# Patient Record
Sex: Female | Born: 1955 | Race: White | Hispanic: No | Marital: Married | Smoking: Former smoker
Health system: Southern US, Community
[De-identification: ages and names within clinical notes are randomized; demographics above are authoritative.]

## PROBLEM LIST (undated history)

## (undated) DIAGNOSIS — F32A Depression, unspecified: Secondary | ICD-10-CM

## (undated) DIAGNOSIS — F419 Anxiety disorder, unspecified: Secondary | ICD-10-CM

## (undated) DIAGNOSIS — K219 Gastro-esophageal reflux disease without esophagitis: Secondary | ICD-10-CM

## (undated) DIAGNOSIS — Z9889 Other specified postprocedural states: Secondary | ICD-10-CM

## (undated) DIAGNOSIS — T8859XA Other complications of anesthesia, initial encounter: Secondary | ICD-10-CM

## (undated) DIAGNOSIS — E785 Hyperlipidemia, unspecified: Secondary | ICD-10-CM

## (undated) DIAGNOSIS — D649 Anemia, unspecified: Secondary | ICD-10-CM

## (undated) DIAGNOSIS — H5440 Blindness, one eye, unspecified eye: Secondary | ICD-10-CM

## (undated) DIAGNOSIS — R112 Nausea with vomiting, unspecified: Secondary | ICD-10-CM

## (undated) DIAGNOSIS — E669 Obesity, unspecified: Secondary | ICD-10-CM

## (undated) DIAGNOSIS — Z87442 Personal history of urinary calculi: Secondary | ICD-10-CM

## (undated) DIAGNOSIS — Z8719 Personal history of other diseases of the digestive system: Secondary | ICD-10-CM

## (undated) DIAGNOSIS — R519 Headache, unspecified: Secondary | ICD-10-CM

## (undated) DIAGNOSIS — E119 Type 2 diabetes mellitus without complications: Secondary | ICD-10-CM

## (undated) DIAGNOSIS — T4145XA Adverse effect of unspecified anesthetic, initial encounter: Secondary | ICD-10-CM

## (undated) DIAGNOSIS — E7800 Pure hypercholesterolemia, unspecified: Secondary | ICD-10-CM

## (undated) HISTORY — PX: CARDIAC CATHETERIZATION: SHX172

## (undated) HISTORY — PX: OTHER SURGICAL HISTORY: SHX169

## (undated) HISTORY — PX: TUBAL LIGATION: SHX77

## (undated) SURGERY — Surgical Case
Anesthesia: *Unknown

---

## 1981-12-03 HISTORY — PX: REDUCTION MAMMAPLASTY: SUR839

## 2004-10-28 ENCOUNTER — Other Ambulatory Visit: Payer: Self-pay

## 2004-10-28 ENCOUNTER — Inpatient Hospital Stay: Payer: Self-pay | Admitting: Rheumatology

## 2005-01-03 ENCOUNTER — Ambulatory Visit: Payer: Self-pay

## 2005-03-01 ENCOUNTER — Ambulatory Visit: Payer: Self-pay | Admitting: Unknown Physician Specialty

## 2005-08-22 ENCOUNTER — Ambulatory Visit: Payer: Self-pay | Admitting: Unknown Physician Specialty

## 2006-11-27 ENCOUNTER — Ambulatory Visit: Payer: Self-pay | Admitting: Unknown Physician Specialty

## 2007-12-09 ENCOUNTER — Ambulatory Visit: Payer: Self-pay | Admitting: Unknown Physician Specialty

## 2007-12-15 ENCOUNTER — Ambulatory Visit: Payer: Self-pay | Admitting: Unknown Physician Specialty

## 2008-12-15 ENCOUNTER — Ambulatory Visit: Payer: Self-pay | Admitting: Unknown Physician Specialty

## 2009-02-09 ENCOUNTER — Ambulatory Visit: Payer: Self-pay | Admitting: Family Medicine

## 2009-12-28 ENCOUNTER — Ambulatory Visit: Payer: Self-pay | Admitting: Unknown Physician Specialty

## 2011-01-12 ENCOUNTER — Ambulatory Visit: Payer: Self-pay | Admitting: Unknown Physician Specialty

## 2011-02-09 ENCOUNTER — Ambulatory Visit: Payer: Self-pay | Admitting: Unknown Physician Specialty

## 2012-01-23 ENCOUNTER — Ambulatory Visit: Payer: Self-pay | Admitting: Unknown Physician Specialty

## 2013-02-18 ENCOUNTER — Ambulatory Visit: Payer: Self-pay | Admitting: Unknown Physician Specialty

## 2013-06-16 ENCOUNTER — Ambulatory Visit: Payer: Self-pay | Admitting: Family Medicine

## 2013-11-20 ENCOUNTER — Ambulatory Visit: Payer: Self-pay | Admitting: Gastroenterology

## 2013-11-24 LAB — PATHOLOGY REPORT

## 2014-02-19 ENCOUNTER — Ambulatory Visit: Payer: Self-pay | Admitting: Internal Medicine

## 2014-07-30 ENCOUNTER — Ambulatory Visit (INDEPENDENT_AMBULATORY_CARE_PROVIDER_SITE_OTHER): Payer: 59 | Admitting: Podiatry

## 2014-07-30 ENCOUNTER — Encounter: Payer: Self-pay | Admitting: Podiatry

## 2014-07-30 ENCOUNTER — Ambulatory Visit (INDEPENDENT_AMBULATORY_CARE_PROVIDER_SITE_OTHER): Payer: 59

## 2014-07-30 VITALS — BP 113/65 | HR 66 | Resp 16 | Ht 62.0 in | Wt 195.0 lb

## 2014-07-30 DIAGNOSIS — M779 Enthesopathy, unspecified: Secondary | ICD-10-CM

## 2014-07-30 DIAGNOSIS — M7750 Other enthesopathy of unspecified foot: Secondary | ICD-10-CM

## 2014-07-30 MED ORDER — TRIAMCINOLONE ACETONIDE 10 MG/ML IJ SUSP
10.0000 mg | Freq: Once | INTRAMUSCULAR | Status: AC
  Administered 2014-07-30: 10 mg

## 2014-07-30 NOTE — Progress Notes (Signed)
Subjective:     Patient ID: Erika Gutierrez, female   DOB: [birth year], 57 y.o.   MRN: [id number]  Foot Pain   patient presents stating I'm getting a lot of pain on top of my left foot and I went to another Dr. who put me in his surgical shoe which has not helped me. It's been really hurting me for the last 3 weeks   Review of Systems  All other systems reviewed and are negative.      Objective:   Physical Exam  Nursing note and vitals reviewed. Constitutional: She is oriented to person, place, and time.  Cardiovascular: Intact distal pulses.   Musculoskeletal: Normal range of motion.  Neurological: She is oriented to person, place, and time.  Skin: Skin is warm.   neurovascular status found to be intact with muscle strength adequate and range of motion subtalar and midtarsal joint within normal limits. Patient's found to have discomfort on the dorsal lateral aspect of the left foot around the midtarsal joint with mild discomfort on the medial side and moderate edema noted upon compression. Digits are well perfused arch height is normal and no other pathology noted with diabetes which is under reasonable control     Assessment:     Probable tendinitis left dorsal foot with possibility for underlying arthritis    Plan:     H&P and x-rays reviewed. Injected the dorsal lateral foot 3 mg Kenalog 5 mg Xylocaine Marcaine mixture and reappoint in 1 week and advised on low grade physical therapy for the area

## 2014-07-30 NOTE — Progress Notes (Signed)
   Subjective:    Patient ID: Erika Gutierrez, female    DOB: 06/14/1956, 58 y.o.   MRN: [id number]  HPI Comments: i have pain and swelling on the top of my left foot. This has been going on for 3 weeks. Its remained the same. It hurts to walk. i have wore a darco shoe but it made it worse. Ive taken advil. i seen dr Ether Griffins 2 weeks ago and he took x-rays, darco shoe, advil and used ice.  Foot Pain      Review of Systems  All other systems reviewed and are negative.      Objective:   Physical Exam        Assessment & Plan:

## 2014-08-06 ENCOUNTER — Ambulatory Visit (INDEPENDENT_AMBULATORY_CARE_PROVIDER_SITE_OTHER): Payer: 59 | Admitting: Podiatry

## 2014-08-06 VITALS — BP 122/69 | HR 76 | Resp 16

## 2014-08-06 DIAGNOSIS — M7750 Other enthesopathy of unspecified foot: Secondary | ICD-10-CM

## 2014-08-06 NOTE — Progress Notes (Signed)
Subjective:     Patient ID: Erika Gutierrez, female   DOB: 02-16-1956, 58 y.o.   MRN: [id number]  HPI patient states that my left foot feels a lot better and I'm able to walk without discomfort   Review of Systems     Objective:   Physical Exam Neurovascular status intact no change in health history well oriented x3 and noted to have significant diminishment of discomfort in the dorsal lateral aspect of the left foot    Assessment:     Tendinitis improved quite dramatically left    Plan:     Advised on supportive shoe and physical therapy. If symptoms persist we will consider orthotics

## 2014-08-25 ENCOUNTER — Ambulatory Visit: Payer: Self-pay | Admitting: Unknown Physician Specialty

## 2015-02-22 ENCOUNTER — Ambulatory Visit: Payer: Self-pay | Admitting: Physician Assistant

## 2015-03-04 ENCOUNTER — Ambulatory Visit
Admit: 2015-03-04 | Disposition: A | Discharge status: Home / Self Care | Payer: Self-pay | Attending: Physician Assistant | Admitting: Physician Assistant

## 2015-03-07 ENCOUNTER — Ambulatory Visit
Admit: 2015-03-07 | Disposition: A | Discharge status: Home / Self Care | Payer: Self-pay | Attending: Physician Assistant | Admitting: Physician Assistant

## 2015-03-07 HISTORY — PX: BREAST BIOPSY: SHX20

## 2015-03-28 LAB — SURGICAL PATHOLOGY

## 2016-02-07 ENCOUNTER — Other Ambulatory Visit: Payer: Self-pay | Admitting: Physician Assistant

## 2016-02-07 DIAGNOSIS — Z1231 Encounter for screening mammogram for malignant neoplasm of breast: Secondary | ICD-10-CM

## 2016-02-23 ENCOUNTER — Ambulatory Visit
Admission: RE | Admit: 2016-02-23 | Discharge: 2016-02-23 | Disposition: A | Discharge status: Home / Self Care | Payer: 59 | Source: Ambulatory Visit | Attending: Physician Assistant | Admitting: Physician Assistant

## 2016-02-23 DIAGNOSIS — Z1231 Encounter for screening mammogram for malignant neoplasm of breast: Secondary | ICD-10-CM | POA: Diagnosis present

## 2016-03-01 ENCOUNTER — Other Ambulatory Visit: Payer: Self-pay | Admitting: Physician Assistant

## 2016-03-01 DIAGNOSIS — R928 Other abnormal and inconclusive findings on diagnostic imaging of breast: Secondary | ICD-10-CM

## 2016-03-09 ENCOUNTER — Ambulatory Visit
Admission: RE | Admit: 2016-03-09 | Discharge: 2016-03-09 | Disposition: A | Discharge status: Home / Self Care | Payer: 59 | Source: Ambulatory Visit | Attending: Physician Assistant | Admitting: Physician Assistant

## 2016-03-09 DIAGNOSIS — N63 Unspecified lump in breast: Secondary | ICD-10-CM | POA: Diagnosis not present

## 2016-03-09 DIAGNOSIS — R921 Mammographic calcification found on diagnostic imaging of breast: Secondary | ICD-10-CM | POA: Insufficient documentation

## 2016-03-09 DIAGNOSIS — R928 Other abnormal and inconclusive findings on diagnostic imaging of breast: Secondary | ICD-10-CM

## 2016-10-31 ENCOUNTER — Ambulatory Visit
Admission: EM | Admit: 2016-10-31 | Discharge: 2016-10-31 | Disposition: A | Discharge status: Home / Self Care | Payer: 59 | Attending: Family Medicine | Admitting: Family Medicine

## 2016-10-31 DIAGNOSIS — R112 Nausea with vomiting, unspecified: Secondary | ICD-10-CM | POA: Diagnosis not present

## 2016-10-31 DIAGNOSIS — J0100 Acute maxillary sinusitis, unspecified: Secondary | ICD-10-CM | POA: Diagnosis not present

## 2016-10-31 DIAGNOSIS — J219 Acute bronchiolitis, unspecified: Secondary | ICD-10-CM

## 2016-10-31 DIAGNOSIS — K529 Noninfective gastroenteritis and colitis, unspecified: Secondary | ICD-10-CM

## 2016-10-31 HISTORY — DX: Type 2 diabetes mellitus without complications: E11.9

## 2016-10-31 HISTORY — DX: Pure hypercholesterolemia, unspecified: E78.00

## 2016-10-31 HISTORY — DX: Anxiety disorder, unspecified: F41.9

## 2016-10-31 LAB — RAPID INFLUENZA A&B ANTIGENS (ARMC ONLY)
INFLUENZA A (ARMC): NEGATIVE
INFLUENZA B (ARMC): NEGATIVE

## 2016-10-31 MED ORDER — BENZONATATE 200 MG PO CAPS: 200.0000 mg | ORAL_CAPSULE | Freq: Three times a day (TID) | ORAL | 0 refills | Status: DC | PRN

## 2016-10-31 MED ORDER — ONDANSETRON 8 MG PO TBDP: 8.0000 mg | ORAL_TABLET | Freq: Three times a day (TID) | ORAL | 0 refills | Status: DC | PRN

## 2016-10-31 MED ORDER — AMOXICILLIN-POT CLAVULANATE 875-125 MG PO TABS: 1.0000 | ORAL_TABLET | Freq: Two times a day (BID) | ORAL | 0 refills | Status: DC

## 2016-10-31 NOTE — ED Provider Notes (Signed)
[facility]    CSN: [id number] Arrival date & time: 10/31/16  1046     History   Chief Complaint Chief Complaint  Patient presents with  . Nausea    HPI Erika Gutierrez is a 60 y.o. female.   Patient with a somewhat unusual history. She states about a week ago her grandson and daughter was sick she seems to have caught they had. Symptoms of a URI nasal congestion coughing and sore throat. Sore throat got better still has the nasal congestion coughing. Most his nasal congestion and coughing doesn't keep her up at night. But now, picking this is the fact that yesterday she developed nausea and vomiting. She states she threw up 3 or 4 times yesterday and has gone up to 3 times today and one time here. She denies it was [facility] states it was actual food that she threw up all these different times that she does not have anything on her stomach at this time. She is a type II diabetic she does not smoke she's had breast biopsy and reduction of the breast as well.. Other than several family members having URI-like symptoms no pertinent past family medical history. She's worried but when she may have the flu.   The history is provided by the patient.  Emesis  Severity:  Moderate Duration:  2 days Timing:  Constant Quality:  Stomach contents Progression:  Worsening Chronicity:  New Relieved by:  Nothing Associated symptoms: cough and sore throat   Associated symptoms: no abdominal pain   Sore throat:    Severity:  Mild   Timing:  Constant   Progression:  Improving Risk factors: diabetes     Past Medical History:  Diagnosis Date  . Anxiety   . Diabetes mellitus without complication (HCC)   . High cholesterol     There are no active problems to display for this patient.   Past Surgical History:  Procedure Laterality Date  . BREAST BIOPSY Left    neg- core  . REDUCTION MAMMAPLASTY Bilateral    Many years ago 10+    OB History    No data available       Home Medications    Prior to Admission medications   Medication Sig Start Date End Date Taking? Authorizing Provider  empagliflozin (JARDIANCE) 10 MG TABS tablet Take 10 mg by mouth daily.   Yes Historical Provider, MD  FLUoxetine (PROZAC) 20 MG tablet Take 20 mg by mouth daily.   Yes Historical Provider, MD  liraglutide (VICTOZA) 18 MG/3ML SOPN Inject into the skin.   Yes Historical Provider, MD  amoxicillin-clavulanate (AUGMENTIN) 875-125 MG tablet Take 1 tablet by mouth 2 (two) times daily. 10/31/16   Hassan RowanEugene Burman Bruington, MD  benzonatate (TESSALON) 200 MG capsule Take 1 capsule (200 mg total) by mouth 3 (three) times daily as needed. 10/31/16   Hassan RowanEugene Skyylar Kopf, MD  FLUoxetine (PROZAC) 20 MG capsule Take by mouth. 04/13/14 04/13/15  Historical Provider, MD  glimepiride (AMARYL) 4 MG tablet Take by mouth. 04/13/14 04/13/15  Historical Provider, MD  insulin glargine (LANTUS) 100 UNIT/ML injection Inject into the skin 2 (two) times daily.     Historical Provider, MD  metFORMIN (GLUCOPHAGE) 1000 MG tablet Take by mouth.    Historical Provider, MD  ondansetron (ZOFRAN ODT) 8 MG disintegrating tablet Take 1 tablet (8 mg total) by mouth every 8 (eight) hours as needed for nausea or vomiting. 10/31/16   Hassan RowanEugene Courage Biglow, MD  pravastatin (PRAVACHOL) 80 MG tablet  Take by mouth.    Historical Provider, MD    Family History Family History  Problem Relation Age of Onset  . Breast cancer Neg Hx     Social History Social History  Substance Use Topics  . Smoking status: Never Smoker  . Smokeless tobacco: Never Used  . Alcohol use No     Allergies   Patient has no known allergies.   Review of Systems Review of Systems  HENT: Positive for rhinorrhea, sinus pressure, sneezing and sore throat.   Respiratory: Positive for cough. Negative for shortness of breath.   Gastrointestinal: Positive for nausea and vomiting. Negative for abdominal pain.  All other systems reviewed and are negative.    Physical  Exam Triage Vital Signs ED Triage Vitals  Enc Vitals Group     BP 10/31/16 1200 137/63     Pulse Rate 10/31/16 1200 66     Resp 10/31/16 1200 20     Temp 10/31/16 1200 97.8 F (36.6 C)     Temp Source 10/31/16 1200 Oral     SpO2 10/31/16 1200 97 %     Weight 10/31/16 1201 185 lb (83.9 kg)     Height 10/31/16 1201 5\' 2"  (1.575 m)     Head Circumference --      Peak Flow --      Pain Score 10/31/16 1204 2     Pain Loc --      Pain Edu? --      Excl. in GC? --    No data found.   Updated Vital Signs BP 137/63 (BP Location: Left Arm)   Pulse 66   Temp 97.8 F (36.6 C) (Oral)   Resp 20   Ht 5\' 2"  (1.575 m)   Wt 185 lb (83.9 kg)   SpO2 97%   BMI 33.84 kg/m   Visual Acuity Right Eye Distance:   Left Eye Distance:   Bilateral Distance:    Right Eye Near:   Left Eye Near:    Bilateral Near:     Physical Exam  Constitutional: She is oriented to person, place, and time. She appears well-developed and well-nourished. No distress.  HENT:  Head: Normocephalic and atraumatic.  Right Ear: External ear normal.  Left Ear: External ear normal.  Eyes: Pupils are equal, round, and reactive to light.  Neck: Normal range of motion. Neck supple.  Cardiovascular: Normal rate, regular rhythm and normal heart sounds.   Pulmonary/Chest: Effort normal and breath sounds normal.  Musculoskeletal: Normal range of motion.  Neurological: She is alert and oriented to person, place, and time. No cranial nerve deficit.  Skin: Skin is warm and dry. She is not diaphoretic.  Psychiatric: She has a normal mood and affect.  Vitals reviewed.    UC Treatments / Results  Labs (all labs ordered are listed, but only abnormal results are displayed) Labs Reviewed  RAPID INFLUENZA A&B ANTIGENS ([facility] ONLY)    EKG  EKG Interpretation None       Radiology No results found.  Procedures Procedures (including critical care time)  Medications Ordered in UC Medications - No data to  display   Initial Impression / Assessment and Plan / UC Course  I have reviewed the triage vital signs and the nursing notes.  Pertinent labs & imaging results that were available during my care of the patient were reviewed by me and considered in my medical decision making (see chart for details).   Results for orders placed or performed during  the hospital encounter of 10/31/16  Rapid Influenza A&B Antigens ([facility] only)  Result Value Ref Range   Influenza A ([facility]) NEGATIVE NEGATIVE   Influenza B ([facility]) NEGATIVE NEGATIVE    Clinical Course     I've explained to her that I do not think she has a flu unlikely she has a URI/bronchitis/sinusitis is gone on for over a week she is a diabetic which would probably need treatment with antibiotic I think she is was this portion of to get a gastroenteritis as well only to treat that separately. Will treat the gastritis with Zofran 8 mg sublingual on a when necessary basis and will treat the sinusitis with Augmentin and Allegra-D. Offered [employer] but she feels the cough she has a control and declines the [place]. Will give a note for work for today and tomorrow. But because she is worried about the flu will give flu test and above his pain in a flu test being negative.  Final Clinical Impressions(s) / UC Diagnoses   Final diagnoses:  Gastroenteritis  Acute bronchiolitis due to unspecified organism  Non-intractable vomiting with nausea, unspecified vomiting type  Subacute maxillary sinusitis    New Prescriptions New Prescriptions   AMOXICILLIN-CLAVULANATE (AUGMENTIN) 875-125 MG TABLET    Take 1 tablet by mouth 2 (two) times daily.   BENZONATATE (TESSALON) 200 MG CAPSULE    Take 1 capsule (200 mg total) by mouth 3 (three) times daily as needed.   ONDANSETRON (ZOFRAN ODT) 8 MG DISINTEGRATING TABLET    Take 1 tablet (8 mg total) by mouth every 8 (eight) hours as needed for nausea or vomiting.     Hassan Rowan, MD 10/31/16 [phone]

## 2016-10-31 NOTE — ED Triage Notes (Addendum)
Started with nasal congestion, cough, sore throat and one day of fever. Fever, and sore throat have since resolved. Yesterday started with nausea and vomiting. No abdominal pain. Mild headache 2/10

## 2016-12-26 ENCOUNTER — Encounter: Payer: Self-pay | Admitting: *Deleted

## 2016-12-26 ENCOUNTER — Ambulatory Visit
Admission: EM | Admit: 2016-12-26 | Discharge: 2016-12-26 | Disposition: A | Discharge status: Home / Self Care | Payer: 59 | Attending: Family Medicine | Admitting: Family Medicine

## 2016-12-26 DIAGNOSIS — N3000 Acute cystitis without hematuria: Secondary | ICD-10-CM

## 2016-12-26 LAB — URINALYSIS, COMPLETE (UACMP) WITH MICROSCOPIC
BILIRUBIN URINE: NEGATIVE
Glucose, UA: 500 mg/dL — AB
KETONES UR: 15 mg/dL — AB
LEUKOCYTES UA: NEGATIVE
Nitrite: NEGATIVE
Specific Gravity, Urine: 1.01 (ref 1.005–1.030)
pH: 5 (ref 5.0–8.0)

## 2016-12-26 LAB — GLUCOSE, CAPILLARY: GLUCOSE-CAPILLARY: 157 mg/dL — AB (ref 65–99)

## 2016-12-26 MED ORDER — NITROFURANTOIN MONOHYD MACRO 100 MG PO CAPS: 100.0000 mg | ORAL_CAPSULE | Freq: Two times a day (BID) | ORAL | 0 refills | Status: DC

## 2016-12-26 NOTE — ED Provider Notes (Signed)
[facility]    CSN: [id number] Arrival date & time: 12/26/16  0809     History   Chief Complaint Chief Complaint  Patient presents with  . Dysuria  . Urinary Frequency  . Urinary Urgency  . Nausea  . Chills    HPI Erika Gutierrez is a 61 y.o. female.   The history is provided by the patient.  Urinary Frequency  This is a new problem. The current episode started yesterday. The problem occurs constantly. The problem has not changed since onset.Pertinent negatives include no chest pain, no abdominal pain, no headaches and no shortness of breath. Associated symptoms comments: Nausea, chills. She has tried nothing for the symptoms.    Past Medical History:  Diagnosis Date  . Anxiety   . Diabetes mellitus without complication (HCC)   . High cholesterol     There are no active problems to display for this patient.   Past Surgical History:  Procedure Laterality Date  . BREAST BIOPSY Left    neg- core  . REDUCTION MAMMAPLASTY Bilateral    Many years ago 10+    OB History    No data available       Home Medications    Prior to Admission medications   Medication Sig Start Date End Date Taking? Authorizing Provider  empagliflozin (JARDIANCE) 10 MG TABS tablet Take 10 mg by mouth daily.   Yes Historical Provider, MD  liraglutide (VICTOZA) 18 MG/3ML SOPN Inject into the skin.   Yes Historical Provider, MD  metFORMIN (GLUCOPHAGE) 1000 MG tablet Take by mouth.   Yes Historical Provider, MD  pravastatin (PRAVACHOL) 80 MG tablet Take by mouth.   Yes Historical Provider, MD  amoxicillin-clavulanate (AUGMENTIN) 875-125 MG tablet Take 1 tablet by mouth 2 (two) times daily. 10/31/16   Hassan Rowan, MD  benzonatate (TESSALON) 200 MG capsule Take 1 capsule (200 mg total) by mouth 3 (three) times daily as needed. 10/31/16   Hassan Rowan, MD  FLUoxetine (PROZAC) 20 MG capsule Take by mouth. 04/13/14 04/13/15  Historical Provider, MD  FLUoxetine (PROZAC) 20 MG tablet  Take 20 mg by mouth daily.    Historical Provider, MD  glimepiride (AMARYL) 4 MG tablet Take by mouth. 04/13/14 04/13/15  Historical Provider, MD  insulin glargine (LANTUS) 100 UNIT/ML injection Inject into the skin 2 (two) times daily.     Historical Provider, MD  nitrofurantoin, macrocrystal-monohydrate, (MACROBID) 100 MG capsule Take 1 capsule (100 mg total) by mouth 2 (two) times daily. 12/26/16   Payton Mccallum, MD  ondansetron (ZOFRAN ODT) 8 MG disintegrating tablet Take 1 tablet (8 mg total) by mouth every 8 (eight) hours as needed for nausea or vomiting. 10/31/16   Hassan Rowan, MD    Family History Family History  Problem Relation Age of Onset  . Breast cancer Neg Hx     Social History Social History  Substance Use Topics  . Smoking status: Never Smoker  . Smokeless tobacco: Never Used  . Alcohol use No     Allergies   Augmentin [amoxicillin-pot clavulanate]   Review of Systems Review of Systems  Respiratory: Negative for shortness of breath.   Cardiovascular: Negative for chest pain.  Gastrointestinal: Negative for abdominal pain.  Genitourinary: Positive for frequency.  Neurological: Negative for headaches.     Physical Exam Triage Vital Signs ED Triage Vitals  Enc Vitals Group     BP 12/26/16 0822 132/77     Pulse Rate 12/26/16 0822 87  Resp 12/26/16 0822 16     Temp 12/26/16 0822 98.6 F (37 C)     Temp Source 12/26/16 0822 Oral     SpO2 12/26/16 0822 95 %     Weight 12/26/16 0824 180 lb (81.6 kg)     Height 12/26/16 0824 5\' 2"  (1.575 m)     Head Circumference --      Peak Flow --      Pain Score --      Pain Loc --      Pain Edu? --      Excl. in GC? --    No data found.   Updated Vital Signs BP 132/77 (BP Location: Left Arm)   Pulse 87   Temp 98.6 F (37 C) (Oral)   Resp 16   Ht 5\' 2"  (1.575 m)   Wt 180 lb (81.6 kg)   SpO2 95%   BMI 32.92 kg/m    Visual Acuity Right Eye Distance:   Left Eye Distance:   Bilateral Distance:     Right Eye Near:   Left Eye Near:    Bilateral Near:     Physical Exam  Constitutional: She appears well-developed and well-nourished. No distress.  Abdominal: Soft. Bowel sounds are normal. She exhibits no distension and no mass. There is no tenderness. There is no rebound and no guarding.  Skin: She is not diaphoretic.  Nursing note and vitals reviewed.    UC Treatments / Results  Labs  Random blood glucose: 157  (all labs ordered are listed, but only abnormal results are displayed) Labs Reviewed  URINALYSIS, COMPLETE (UACMP) WITH MICROSCOPIC - Abnormal; Notable for the following:       Result Value   APPearance HAZY (*)    Glucose, UA 500 (*)    Hgb urine dipstick SMALL (*)    Ketones, ur 15 (*)    Protein, ur TRACE (*)    Squamous Epithelial / LPF 0-5 (*)    Bacteria, UA FEW (*)    All other components within normal limits  GLUCOSE, CAPILLARY - Abnormal; Notable for the following:    Glucose-Capillary 157 (*)    All other components within normal limits  CBG MONITORING, ED    EKG  EKG Interpretation None       Radiology No results found.  Procedures Procedures (including critical care time)  Medications Ordered in UC Medications - No data to display   Initial Impression / Assessment and Plan / UC Course  I have reviewed the triage vital signs and the nursing notes.  Pertinent labs & imaging results that were available during my care of the patient were reviewed by me and considered in my medical decision making (see chart for details).       Final Clinical Impressions(s) / UC Diagnoses   Final diagnoses:  Acute cystitis without hematuria    New Prescriptions Discharge Medication List as of 12/26/2016  9:08 AM    START taking these medications   Details  nitrofurantoin, macrocrystal-monohydrate, (MACROBID) 100 MG capsule Take 1 capsule (100 mg total) by mouth 2 (two) times daily., Starting Wed 12/26/2016, Normal       1. Lab results and  diagnosis reviewed with patient 2. rx as per orders above; reviewed possible side effects, interactions, risks and benefits  3. Recommend supportive treatment with increased fluids/clear liquids, otc analgesics, zofran prn (patient has at home) 4. Follow-up prn if symptoms worsen or don't improve   Payton Mccallumrlando Overton Boggus, MD 12/26/16 [phone]

## 2016-12-26 NOTE — ED Triage Notes (Signed)
Dysuria, urinary freq/urg, chills, since yesterday.

## 2017-01-03 ENCOUNTER — Telehealth: Payer: Self-pay | Admitting: *Deleted

## 2017-01-03 MED ORDER — CIPROFLOXACIN HCL 500 MG PO TABS: 500.0000 mg | ORAL_TABLET | Freq: Two times a day (BID) | ORAL | 0 refills | Status: DC

## 2017-01-03 NOTE — Telephone Encounter (Signed)
Returned patient call, verified DOB, patient reports her UTI symptoms have not resolved. As per Dr Thurmond ButtsWade, a prescription for Cipro was called into patient's pharmacy on record. Patient confirmed understanding of information.

## 2017-02-06 ENCOUNTER — Other Ambulatory Visit: Payer: Self-pay | Admitting: Physician Assistant

## 2017-02-06 DIAGNOSIS — Z1231 Encounter for screening mammogram for malignant neoplasm of breast: Secondary | ICD-10-CM

## 2017-02-06 DIAGNOSIS — K219 Gastro-esophageal reflux disease without esophagitis: Secondary | ICD-10-CM | POA: Insufficient documentation

## 2017-03-11 ENCOUNTER — Ambulatory Visit
Admission: RE | Admit: 2017-03-11 | Discharge: 2017-03-11 | Disposition: A | Discharge status: Home / Self Care | Payer: 59 | Source: Ambulatory Visit | Attending: Physician Assistant | Admitting: Physician Assistant

## 2017-03-11 DIAGNOSIS — Z1231 Encounter for screening mammogram for malignant neoplasm of breast: Secondary | ICD-10-CM | POA: Diagnosis not present

## 2017-07-03 DIAGNOSIS — M1712 Unilateral primary osteoarthritis, left knee: Secondary | ICD-10-CM | POA: Insufficient documentation

## 2017-09-06 ENCOUNTER — Other Ambulatory Visit: Payer: Self-pay | Admitting: Orthopedic Surgery

## 2017-09-06 DIAGNOSIS — M25562 Pain in left knee: Secondary | ICD-10-CM

## 2017-09-10 ENCOUNTER — Ambulatory Visit
Admission: RE | Admit: 2017-09-10 | Discharge: 2017-09-10 | Disposition: A | Discharge status: Home / Self Care | Payer: 59 | Source: Ambulatory Visit | Attending: Orthopedic Surgery | Admitting: Orthopedic Surgery

## 2017-09-10 DIAGNOSIS — X58XXXA Exposure to other specified factors, initial encounter: Secondary | ICD-10-CM | POA: Diagnosis not present

## 2017-09-10 DIAGNOSIS — M67462 Ganglion, left knee: Secondary | ICD-10-CM | POA: Insufficient documentation

## 2017-09-10 DIAGNOSIS — S83282A Other tear of lateral meniscus, current injury, left knee, initial encounter: Secondary | ICD-10-CM | POA: Insufficient documentation

## 2017-09-10 DIAGNOSIS — M25562 Pain in left knee: Secondary | ICD-10-CM | POA: Diagnosis present

## 2017-09-10 DIAGNOSIS — M1712 Unilateral primary osteoarthritis, left knee: Secondary | ICD-10-CM | POA: Diagnosis not present

## 2017-09-30 ENCOUNTER — Inpatient Hospital Stay: Admission: RE | Admit: 2017-09-30 | Payer: 59 | Source: Ambulatory Visit

## 2017-10-01 ENCOUNTER — Encounter
Admission: RE | Admit: 2017-10-01 | Discharge: 2017-10-01 | Disposition: A | Discharge status: Home / Self Care | Payer: 59 | Source: Ambulatory Visit | Attending: Orthopedic Surgery | Admitting: Orthopedic Surgery

## 2017-10-01 DIAGNOSIS — Z01818 Encounter for other preprocedural examination: Secondary | ICD-10-CM | POA: Diagnosis present

## 2017-10-01 HISTORY — DX: Personal history of other diseases of the digestive system: Z87.19

## 2017-10-01 HISTORY — DX: Other specified postprocedural states: Z98.890

## 2017-10-01 HISTORY — DX: Adverse effect of unspecified anesthetic, initial encounter: T41.45XA

## 2017-10-01 HISTORY — DX: Gastro-esophageal reflux disease without esophagitis: K21.9

## 2017-10-01 HISTORY — DX: Nausea with vomiting, unspecified: R11.2

## 2017-10-01 HISTORY — DX: Other complications of anesthesia, initial encounter: T88.59XA

## 2017-10-01 LAB — BASIC METABOLIC PANEL
Anion gap: 10 (ref 5–15)
BUN: 9 mg/dL (ref 6–20)
CHLORIDE: 107 mmol/L (ref 101–111)
CO2: 26 mmol/L (ref 22–32)
CREATININE: 0.65 mg/dL (ref 0.44–1.00)
Calcium: 9.8 mg/dL (ref 8.9–10.3)
GFR calc non Af Amer: >60 mL/min (ref >60)
Glucose, Bld: 171 mg/dL — ABNORMAL HIGH (ref 65–99)
POTASSIUM: 3.9 mmol/L (ref 3.5–5.1)
Sodium: 143 mmol/L (ref 135–145)

## 2017-10-01 NOTE — Pre-Procedure Instructions (Signed)
[facility]19[facility] Component Name Value Ref Range  Vent Rate (bpm) 58   PR Interval (msec) 160   QRS Interval (msec) 84   QT Interval (msec) 438   QTc (msec) 429   Result Narrative  Sinus bradycardia Left axis deviation Abnormal ECG No previous ECGs available I reviewed and concur with this report. Electronically signed WU:[id number]:MILLER MD, MARK ([id number]) on 08/22/2017 1:39:34 PM

## 2017-10-01 NOTE — Patient Instructions (Signed)
Your procedure is scheduled on: Monday nov. 5, 2018 Report to Same Day surgery. To find out your arrival time please call [phone] between 1PM - 3PM on Friday Nov. 2, 2018.  Remember: Instructions that are not followed completely may result in serious medical risk, up to and including death, or upon the discretion of your surgeon and anesthesiologist your surgery may need to be rescheduled.     _X__ 1. Do not eat food after midnight the night before your procedure.                 No gum chewing or hard candies. You may drink clear liquids up to 2 hours                 before you are scheduled to arrive for your surgery- DO not drink clear                 liquids within 2 hours of the start of your surgery.                 Clear Liquids include:  water.     ___ 2.  No Alcohol for 24 hours before or after surgery.   ___ 3.  Do Not Smoke or use e-cigarettes For 24 Hours Prior to Your Surgery.                 Do not use any chewable tobacco products for at least 6 hours prior to                 surgery.  ____  4.  Bring all medications with you on the day of surgery if instructed.   _x___  5.  Notify your doctor if there is any change in your medical condition      (cold, fever, infections).     Do not wear jewelry, make-up, hairpins, clips or nail polish. Do not wear lotions, powders, or perfumes. You may wear deodorant. Do not shave 48 hours prior to surgery. Men may shave face and neck. Do not bring valuables to the hospital.    [facility] any belongings or valuables.  Contacts, dentures or bridgework may not be worn into surgery. Leave your suitcase in the car. After surgery it may be brought to your room. For patients admitted to the hospital, discharge time is determined by your treatment team.   Patients discharged the day of surgery will not be allowed to drive home.   Please read over the following fact sheets that you  were given:   Preparing for surgery  __x__ Take these medicines the morning of surgery with A SIP OF WATER:    1. esomeprazole (NEXIUM) take at bedtime the night prior to surgery and the am of surgery.  2. rosuvastatin (CRESTOR)     ____ Fleet Enema (as directed)   __x__ Use CHG Soap as directed  ____ Use inhalers on the day of surgery  __x__ Stop metformin 2 days prior to surgery    __x__ Take 1/2 of usual insulin dose the night before surgery. No insulin the morning          of surgery.   ____ Stop Coumadin/Plavix/aspirin on does not apply.  _x___ Stop Anti-inflammatories on Advil, Aleve, Motrin, Ibuprofen, Naproxen, Naprosyn, Goodies Powders or Aspirin  Products.OK to take Tylenol.   ____ Stop supplements: until after surgery.    ____ Bring C-Pap to the hospital.

## 2017-10-07 ENCOUNTER — Ambulatory Visit: Payer: 59 | Admitting: Anesthesiology

## 2017-10-07 ENCOUNTER — Encounter: Payer: Self-pay | Admitting: Orthopedic Surgery

## 2017-10-07 ENCOUNTER — Encounter
Admission: RE | Disposition: A | Discharge status: Home / Self Care | Payer: Self-pay | Source: Ambulatory Visit | Attending: Orthopedic Surgery

## 2017-10-07 ENCOUNTER — Ambulatory Visit
Admission: RE | Admit: 2017-10-07 | Discharge: 2017-10-07 | Disposition: A | Discharge status: Home / Self Care | Payer: 59 | Source: Ambulatory Visit | Attending: Orthopedic Surgery | Admitting: Orthopedic Surgery

## 2017-10-07 DIAGNOSIS — M94262 Chondromalacia, left knee: Secondary | ICD-10-CM | POA: Insufficient documentation

## 2017-10-07 DIAGNOSIS — Z794 Long term (current) use of insulin: Secondary | ICD-10-CM | POA: Diagnosis not present

## 2017-10-07 DIAGNOSIS — K219 Gastro-esophageal reflux disease without esophagitis: Secondary | ICD-10-CM | POA: Insufficient documentation

## 2017-10-07 DIAGNOSIS — S83282A Other tear of lateral meniscus, current injury, left knee, initial encounter: Secondary | ICD-10-CM | POA: Diagnosis not present

## 2017-10-07 DIAGNOSIS — Q120 Congenital cataract: Secondary | ICD-10-CM | POA: Insufficient documentation

## 2017-10-07 DIAGNOSIS — E119 Type 2 diabetes mellitus without complications: Secondary | ICD-10-CM | POA: Diagnosis not present

## 2017-10-07 DIAGNOSIS — Z87891 Personal history of nicotine dependence: Secondary | ICD-10-CM | POA: Insufficient documentation

## 2017-10-07 DIAGNOSIS — M2392 Unspecified internal derangement of left knee: Secondary | ICD-10-CM | POA: Diagnosis present

## 2017-10-07 DIAGNOSIS — D509 Iron deficiency anemia, unspecified: Secondary | ICD-10-CM | POA: Insufficient documentation

## 2017-10-07 DIAGNOSIS — X58XXXA Exposure to other specified factors, initial encounter: Secondary | ICD-10-CM | POA: Diagnosis not present

## 2017-10-07 DIAGNOSIS — E785 Hyperlipidemia, unspecified: Secondary | ICD-10-CM | POA: Diagnosis not present

## 2017-10-07 DIAGNOSIS — Z791 Long term (current) use of non-steroidal anti-inflammatories (NSAID): Secondary | ICD-10-CM | POA: Insufficient documentation

## 2017-10-07 DIAGNOSIS — E7800 Pure hypercholesterolemia, unspecified: Secondary | ICD-10-CM | POA: Insufficient documentation

## 2017-10-07 DIAGNOSIS — E669 Obesity, unspecified: Secondary | ICD-10-CM | POA: Insufficient documentation

## 2017-10-07 DIAGNOSIS — F32A Depression, unspecified: Secondary | ICD-10-CM | POA: Insufficient documentation

## 2017-10-07 DIAGNOSIS — D510 Vitamin B12 deficiency anemia due to intrinsic factor deficiency: Secondary | ICD-10-CM | POA: Insufficient documentation

## 2017-10-07 DIAGNOSIS — Z79899 Other long term (current) drug therapy: Secondary | ICD-10-CM | POA: Diagnosis not present

## 2017-10-07 DIAGNOSIS — H5462 Unqualified visual loss, left eye, normal vision right eye: Secondary | ICD-10-CM | POA: Diagnosis not present

## 2017-10-07 DIAGNOSIS — K449 Diaphragmatic hernia without obstruction or gangrene: Secondary | ICD-10-CM | POA: Insufficient documentation

## 2017-10-07 DIAGNOSIS — F329 Major depressive disorder, single episode, unspecified: Secondary | ICD-10-CM | POA: Insufficient documentation

## 2017-10-07 HISTORY — PX: KNEE ARTHROSCOPY WITH LATERAL MENISECTOMY: SHX6193

## 2017-10-07 HISTORY — PX: CHONDROPLASTY: SHX5177

## 2017-10-07 LAB — GLUCOSE, CAPILLARY
GLUCOSE-CAPILLARY: 151 mg/dL — AB (ref 65–99)
Glucose-Capillary: 177 mg/dL — ABNORMAL HIGH (ref 65–99)

## 2017-10-07 SURGERY — ARTHROSCOPY, KNEE, WITH LATERAL MENISCECTOMY
Anesthesia: General | Laterality: Left

## 2017-10-07 MED ORDER — PROPOFOL 10 MG/ML IV BOLUS
INTRAVENOUS | Status: DC | PRN
  Administered 2017-10-07: 150 mg via INTRAVENOUS

## 2017-10-07 MED ORDER — ONDANSETRON HCL 4 MG PO TABS: 4.0000 mg | ORAL_TABLET | Freq: Four times a day (QID) | ORAL | Status: DC | PRN

## 2017-10-07 MED ORDER — HYDROCODONE-ACETAMINOPHEN 5-325 MG PO TABS: 1.0000 | ORAL_TABLET | ORAL | 0 refills | Status: DC | PRN

## 2017-10-07 MED ORDER — GLYCOPYRROLATE 0.2 MG/ML IJ SOLN
INTRAMUSCULAR | Status: AC
  Filled 2017-10-07: qty 1

## 2017-10-07 MED ORDER — BUPIVACAINE-EPINEPHRINE (PF) 0.25% -1:200000 IJ SOLN
INTRAMUSCULAR | Status: AC
  Filled 2017-10-07: qty 10

## 2017-10-07 MED ORDER — DEXAMETHASONE SODIUM PHOSPHATE 10 MG/ML IJ SOLN
INTRAMUSCULAR | Status: DC | PRN
  Administered 2017-10-07: 10 mg via INTRAVENOUS

## 2017-10-07 MED ORDER — ONDANSETRON HCL 4 MG/2ML IJ SOLN
INTRAMUSCULAR | Status: DC | PRN
  Administered 2017-10-07: 4 mg via INTRAVENOUS

## 2017-10-07 MED ORDER — METOCLOPRAMIDE HCL 5 MG/ML IJ SOLN: 5.0000 mg | Freq: Three times a day (TID) | INTRAMUSCULAR | Status: DC | PRN

## 2017-10-07 MED ORDER — MORPHINE SULFATE (PF) 4 MG/ML IV SOLN
INTRAVENOUS | Status: AC
  Filled 2017-10-07: qty 1

## 2017-10-07 MED ORDER — ACETAMINOPHEN 10 MG/ML IV SOLN
INTRAVENOUS | Status: DC | PRN
  Administered 2017-10-07: 1000 mg via INTRAVENOUS

## 2017-10-07 MED ORDER — METOCLOPRAMIDE HCL 10 MG PO TABS: 5.0000 mg | ORAL_TABLET | Freq: Three times a day (TID) | ORAL | Status: DC | PRN

## 2017-10-07 MED ORDER — MIDAZOLAM HCL 2 MG/2ML IJ SOLN
INTRAMUSCULAR | Status: AC
  Filled 2017-10-07: qty 2

## 2017-10-07 MED ORDER — GLYCOPYRROLATE 0.2 MG/ML IJ SOLN
INTRAMUSCULAR | Status: DC | PRN
  Administered 2017-10-07: 0.2 mg via INTRAVENOUS

## 2017-10-07 MED ORDER — CHLORHEXIDINE GLUCONATE 4 % EX LIQD: 60.0000 mL | Freq: Once | CUTANEOUS | Status: DC

## 2017-10-07 MED ORDER — BUPIVACAINE-EPINEPHRINE 0.25% -1:200000 IJ SOLN
INTRAMUSCULAR | Status: DC | PRN
  Administered 2017-10-07: 5 mL

## 2017-10-07 MED ORDER — FENTANYL CITRATE (PF) 100 MCG/2ML IJ SOLN: 25.0000 ug | INTRAMUSCULAR | Status: DC | PRN

## 2017-10-07 MED ORDER — PROPOFOL 10 MG/ML IV BOLUS
INTRAVENOUS | Status: AC
  Filled 2017-10-07: qty 20

## 2017-10-07 MED ORDER — HYDROCODONE-ACETAMINOPHEN 5-325 MG PO TABS
1.0000 | ORAL_TABLET | ORAL | Status: DC | PRN
  Administered 2017-10-07: 1 via ORAL

## 2017-10-07 MED ORDER — MIDAZOLAM HCL 2 MG/2ML IJ SOLN
INTRAMUSCULAR | Status: DC | PRN
  Administered 2017-10-07: 2 mg via INTRAVENOUS

## 2017-10-07 MED ORDER — SODIUM CHLORIDE 0.9 % IV SOLN
INTRAVENOUS | Status: DC
  Administered 2017-10-07: 15:00:00 via INTRAVENOUS

## 2017-10-07 MED ORDER — DEXAMETHASONE SODIUM PHOSPHATE 10 MG/ML IJ SOLN
INTRAMUSCULAR | Status: AC
  Filled 2017-10-07: qty 1

## 2017-10-07 MED ORDER — BUPIVACAINE-EPINEPHRINE (PF) 0.25% -1:200000 IJ SOLN
INTRAMUSCULAR | Status: AC
  Filled 2017-10-07: qty 20

## 2017-10-07 MED ORDER — ACETAMINOPHEN 10 MG/ML IV SOLN
INTRAVENOUS | Status: AC
  Filled 2017-10-07: qty 100

## 2017-10-07 MED ORDER — OXYCODONE HCL 5 MG PO TABS: 5.0000 mg | ORAL_TABLET | Freq: Once | ORAL | Status: DC | PRN

## 2017-10-07 MED ORDER — EPHEDRINE SULFATE 50 MG/ML IJ SOLN
INTRAMUSCULAR | Status: AC
  Filled 2017-10-07: qty 1

## 2017-10-07 MED ORDER — PROMETHAZINE HCL 25 MG/ML IJ SOLN: 6.2500 mg | INTRAMUSCULAR | Status: DC | PRN

## 2017-10-07 MED ORDER — ONDANSETRON HCL 4 MG/2ML IJ SOLN: 4.0000 mg | Freq: Four times a day (QID) | INTRAMUSCULAR | Status: DC | PRN

## 2017-10-07 MED ORDER — SODIUM CHLORIDE 0.9 % IV SOLN: INTRAVENOUS | Status: DC

## 2017-10-07 MED ORDER — FENTANYL CITRATE (PF) 100 MCG/2ML IJ SOLN
INTRAMUSCULAR | Status: DC | PRN
  Administered 2017-10-07: 25 ug via INTRAVENOUS
  Administered 2017-10-07: 50 ug via INTRAVENOUS
  Administered 2017-10-07: 25 ug via INTRAVENOUS
  Administered 2017-10-07 (×2): 50 ug via INTRAVENOUS

## 2017-10-07 MED ORDER — FENTANYL CITRATE (PF) 100 MCG/2ML IJ SOLN
INTRAMUSCULAR | Status: AC
  Filled 2017-10-07: qty 2

## 2017-10-07 MED ORDER — EPHEDRINE SULFATE 50 MG/ML IJ SOLN
INTRAMUSCULAR | Status: DC | PRN
  Administered 2017-10-07: 10 mg via INTRAVENOUS

## 2017-10-07 MED ORDER — HYDROCODONE-ACETAMINOPHEN 5-325 MG PO TABS
ORAL_TABLET | ORAL | Status: AC
  Administered 2017-10-07: 1 via ORAL
  Filled 2017-10-07: qty 1

## 2017-10-07 MED ORDER — ONDANSETRON HCL 4 MG/2ML IJ SOLN
INTRAMUSCULAR | Status: AC
  Filled 2017-10-07: qty 2

## 2017-10-07 MED ORDER — OXYCODONE HCL 5 MG/5ML PO SOLN: 5.0000 mg | Freq: Once | ORAL | Status: DC | PRN

## 2017-10-07 SURGICAL SUPPLY — 27 items
ADAPTER IRRIG TUBE 2 SPIKE SOL (ADAPTER) ×8 IMPLANT
BLADE SHAVER 4.5 DBL SERAT CV (CUTTER) IMPLANT
CUFF TOURN 24 STER (MISCELLANEOUS) IMPLANT
CUFF TOURN 30 STER DUAL PORT (MISCELLANEOUS) ×4 IMPLANT
DRSG DERMACEA 8X12 NADH (GAUZE/BANDAGES/DRESSINGS) ×4 IMPLANT
DURAPREP 26ML APPLICATOR (WOUND CARE) ×4 IMPLANT
GAUZE SPONGE 4X4 12PLY STRL (GAUZE/BANDAGES/DRESSINGS) ×4 IMPLANT
GLOVE BIO SURGEON STRL SZ 6.5 (GLOVE) ×12 IMPLANT
GLOVE BIO SURGEONS STRL SZ 6.5 (GLOVE) ×4
GLOVE BIOGEL M STRL SZ7.5 (GLOVE) ×4 IMPLANT
GLOVE INDICATOR 8.0 STRL GRN (GLOVE) ×4 IMPLANT
GOWN STRL REUS W/ TWL LRG LVL3 (GOWN DISPOSABLE) ×4 IMPLANT
GOWN STRL REUS W/TWL LRG LVL3 (GOWN DISPOSABLE) ×4
IV LACTATED RINGER IRRG 3000ML (IV SOLUTION) ×16
IV LR IRRIG 3000ML ARTHROMATIC (IV SOLUTION) ×16 IMPLANT
KIT RM TURNOVER STRD PROC AR (KITS) ×4 IMPLANT
MANIFOLD NEPTUNE II (INSTRUMENTS) ×4 IMPLANT
PACK ARTHROSCOPY KNEE (MISCELLANEOUS) ×4 IMPLANT
SET TUBE SUCT SHAVER OUTFL 24K (TUBING) ×4 IMPLANT
SET TUBE TIP INTRA-ARTICULAR (MISCELLANEOUS) ×4 IMPLANT
SOL PREP PVP 2OZ (MISCELLANEOUS) ×4
SOLUTION PREP PVP 2OZ (MISCELLANEOUS) ×2 IMPLANT
SUT ETHILON 3-0 FS-10 30 BLK (SUTURE) ×4
SUTURE EHLN 3-0 FS-10 30 BLK (SUTURE) ×2 IMPLANT
TUBING ARTHRO INFLOW-ONLY STRL (TUBING) ×4 IMPLANT
WAND HAND CNTRL MULTIVAC 50 (MISCELLANEOUS) ×4 IMPLANT
WRAP KNEE W/COLD PACKS 25.5X14 (SOFTGOODS) ×4 IMPLANT

## 2017-10-07 NOTE — Transfer of Care (Signed)
Immediate Anesthesia Transfer of Care Note  Patient: Erika Gutierrez  Procedure(s) Performed: KNEE ARTHROSCOPY WITH PARTIAL LATERAL MENISECTOMY CHONDROPLASTY  Patient Location: PACU  Anesthesia Type:General  Level of Consciousness: drowsy and patient cooperative  Airway & Oxygen Therapy: Patient Spontanous Breathing and Patient connected to face mask oxygen  Post-op Assessment: Report given to RN and Post -op Vital signs reviewed and stable  Post vital signs: Reviewed and stable  Last Vitals:  Vitals:   10/07/17 1420 10/07/17 1751  BP: 139/79 (!) 113/45  Pulse: 82 67  Resp: 18 (!) 22  Temp: 36.6 C (!) 36.2 C  SpO2: 100% 94%    Last Pain:  Vitals:   10/07/17 1420  TempSrc: Oral  PainSc: 2       Patients Stated Pain Goal: 2 (10/07/17 1420)  Complications: No apparent anesthesia complications

## 2017-10-07 NOTE — Discharge Instructions (Signed)
°  Instructions after Knee Arthroscopy  ° ° Darcey Cardy P. Ermelinda Eckert, Jr., M.D.    ° Dept. of Orthopaedics & Sports Medicine ° [facility] ° [address] ° [place], [place]  [address] ° ° Phone: [phone]   Fax: [phone] ° ° °DIET: °• Drink plenty of non-alcoholic fluids & begin a light diet. °• Resume your normal diet the day after surgery. ° °ACTIVITY:  °• You may use crutches or a walker with weight-bearing as tolerated, unless instructed otherwise. °• You may wean yourself off of the walker or crutches as tolerated.  °• Begin doing gentle exercises. Exercising will reduce the pain and swelling, increase motion, and prevent muscle weakness.   °• Avoid strenuous activities or athletics for a minimum of 4-6 weeks after arthroscopic surgery. °• Do not drive or operate any equipment until instructed. ° °WOUND CARE:  °• Place one to two pillows under the knee the first day or two when sitting or lying.  °• Continue to use the ice packs periodically to reduce pain and swelling. °• The small incisions in your knee are closed with nylon stitches. The stitches will be removed in the office. °• The bulky dressing may be removed on the second day after surgery. DO NOT TOUCH THE STITCHES. Put a Band-Aid over each stitch. Do NOT use any ointments or creams on the incisions.  °• You may bathe or shower after the stitches are removed at the first office visit following surgery. ° °MEDICATIONS: °• You may resume your regular medications. °• Please take the pain medication as prescribed. °• Do not take pain medication on an empty stomach. °• Do not drive or drink alcoholic beverages when taking pain medications. ° °CALL THE OFFICE FOR: °• Temperature above 101 degrees °• Excessive bleeding or drainage on the dressing. °• Excessive swelling, coldness, or paleness of the toes. °• Persistent nausea and vomiting. ° °FOLLOW-UP:  °• You should have an appointment to return to the office in 7-10 days after surgery.  °  °

## 2017-10-07 NOTE — Anesthesia Procedure Notes (Signed)
Procedure Name: LMA Insertion Date/Time: 10/07/2017 4:09 PM Performed by: Omer JackWeatherly, Alpha Chouinard, CRNA Pre-anesthesia Checklist: Patient identified, Patient being monitored, Timeout performed, Emergency Drugs available and Suction available Patient Re-evaluated:Patient Re-evaluated prior to induction Oxygen Delivery Method: Circle system utilized Preoxygenation: Pre-oxygenation with 100% oxygen Induction Type: IV induction Ventilation: Mask ventilation without difficulty LMA: LMA inserted LMA Size: 3.5 Tube type: Oral Number of attempts: 1 Placement Confirmation: positive ETCO2 and breath sounds checked- equal and bilateral Tube secured with: Tape Dental Injury: Teeth and Oropharynx as per pre-operative assessment

## 2017-10-07 NOTE — Anesthesia Post-op Follow-up Note (Signed)
Anesthesia QCDR form completed.        

## 2017-10-07 NOTE — Op Note (Signed)
OPERATIVE NOTE  DATE OF SURGERY:  10/07/2017  PATIENT NAME:  Erika Gutierrez   DOB: 02/08/1956  MRN: [id number]   PRE-OPERATIVE DIAGNOSIS:  Internal derangement of the left knee   POST-OPERATIVE DIAGNOSIS:   Tear of the anterior and posterior horns of the lateral meniscus, left knee Grade III chondromalacia of the medial, lateral, and patellofemoral compartments, left knee  PROCEDURE:  Left knee arthroscopy, partial lateral meniscectomy, and chondroplasty  SURGEON:  Jena GaussJames P Raylie Maddison, Jr., M.D.   ASSISTANT: none  ANESTHESIA: general  ESTIMATED BLOOD LOSS: Minimal  FLUIDS REPLACED: 600 mL of crystalloid  TOURNIQUET TIME: Not used  DRAINS: none  IMPLANTS UTILIZED: None  INDICATIONS FOR SURGERY: Erika SkillDeborah Barbee Morath is a 61 y.o. year old female who has been seen for complaints of left knee pain. MRI demonstrated findings consistent with meniscal pathology. After discussion of the risks and benefits of surgical intervention, the patient expressed understanding of the risks benefits and agree with plans for left knee arthroscopy.   PROCEDURE IN DETAIL: The patient was brought into the operating room and, after adequate general anesthesia was achieved, a tourniquet was applied to the left thigh and the leg was placed in the leg holder. All bony prominences were well padded. The patient's left knee was cleaned and prepped with alcohol and Duraprep and draped in the usual sterile fashion. A "timeout" was performed as per usual protocol. The anticipated portal sites were injected with 0.25% Marcaine with epinephrine. An anterolateral incision was made and a cannula was inserted. A small effusion was evacuated and the knee was distended with fluid using the pump. The scope was advanced down the medial gutter into the medial compartment. Under visualization with the scope, an anteromedial portal was created and a hooked probe was inserted. The medial meniscus was visualized and probed.  Medial  meniscus was in good condition.  The articular cartilage was visualized.  There were grade 2-3 changes of chondromalacia involving the medial femoral condyle.  These areas were debrided and contoured using the 50 degree ArthroCare wand.  The scope was then advanced into the intercondylar notch. The anterior cruciate ligament was visualized and probed and felt to be intact. The scope was removed from the lateral portal and reinserted via the anteromedial portal to better visualize the lateral compartment. The lateral meniscus was visualized and probed.  There was a complex tear of the anterior horn of the lateral meniscus with continuation of the tear extending posteriorly.  A relatively large flap type lesion was encountered.  The tear was debrided using meniscal punches and a 4.5 mm shaver.  Final contouring was performed using the 50 degree ArthroCare wand.  The remaining rim of meniscus was visualized and probed and found to be stable.  The articular cartilage of the lateral compartment was visualized.  There were grade 3 changes of chondromalacia involving the lateral femoral condyle and lateral tibial plateau.  These areas were debrided and contoured using ArthroCare wand.  Finally, the scope was advanced so as to visualize the patellofemoral articulation. Good patellar tracking was appreciated.  Grade 2-3 changes of chondromalacia were noted to the patellofemoral articulation and were contoured using the ArthroCare wand.  The knee was irrigated with copius amounts of fluid and suctioned dry. The anterolateral portal was re-approximated with #3-0 nylon. A combination of 0.25% Marcaine with epinephrine and 4 mg of Morphine were injected via the scope. The scope was removed and the anteromedial portal was re-approximated with #3-0 nylon. A sterile dressing was  applied followed by application of an ice wrap.  The patient tolerated the procedure well and was transported to the PACU in stable  condition.  Huber Mathers P. Holley Bouche., M.D.

## 2017-10-07 NOTE — Anesthesia Postprocedure Evaluation (Signed)
Anesthesia Post Note  Patient: Erika MulliganDeborah Barbee Spain  Procedure(s) Performed: KNEE ARTHROSCOPY WITH PARTIAL LATERAL MENISECTOMY CHONDROPLASTY  Patient location during evaluation: PACU Anesthesia Type: General Level of consciousness: awake and alert Pain management: pain level controlled Vital Signs Assessment: post-procedure vital signs reviewed and stable Respiratory status: spontaneous breathing and respiratory function stable Cardiovascular status: stable Anesthetic complications: no     Last Vitals:  Vitals:   10/07/17 1822 10/07/17 1838  BP: (!) 164/85 (!) 142/80  Pulse: 77   Resp: 17   Temp:  (!) 36.4 C  SpO2: 93% 100%    Last Pain:  Vitals:   10/07/17 1838  TempSrc:   PainSc: 3                  Elick Aguilera K

## 2017-10-07 NOTE — H&P (Signed)
The patient has been re-examined, and the chart reviewed, and there have been no interval changes to the documented history and physical.    The risks, benefits, and alternatives have been discussed at length. The patient expressed understanding of the risks benefits and agreed with plans for surgical intervention.  Erika Gutierrez, Jr. M.D.    

## 2017-10-07 NOTE — Anesthesia Preprocedure Evaluation (Signed)
Anesthesia Evaluation  Patient identified by MRN, date of birth, ID band Patient awake    Reviewed: Allergy & Precautions, H&P , NPO status , Patient's Chart, lab work & pertinent test results  History of Anesthesia Complications (+) PONV and history of anesthetic complications  Airway Mallampati: III  TM Distance: >3 FB Neck ROM: full    Dental  (+) Chipped   Pulmonary neg pulmonary ROS, neg shortness of breath,           Cardiovascular Exercise Tolerance: Good (-) angina(-) Past MI and (-) DOE negative cardio ROS       Neuro/Psych PSYCHIATRIC DISORDERS Anxiety Depression  Neuromuscular disease    GI/Hepatic Neg liver ROS, GERD  Medicated and Controlled,  Endo/Other  diabetes, Well Controlled, Type 2  Renal/GU      Musculoskeletal  (+) Arthritis ,   Abdominal   Peds  Hematology negative hematology ROS (+)   Anesthesia Other Findings Past Medical History: No date: Anxiety No date: Complication of anesthesia No date: Diabetes mellitus without complication (HCC) No date: GERD (gastroesophageal reflux disease) No date: High cholesterol No date: History of hiatal hernia No date: PONV (postoperative nausea and vomiting)  Past Surgical History: No date: BREAST BIOPSY; Left     Comment:  neg- core No date: REDUCTION MAMMAPLASTY; Bilateral     Comment:  Many years ago 10+  BMI    Body Mass Index:  34.20 kg/m      Reproductive/Obstetrics negative OB ROS                             Anesthesia Physical Anesthesia Plan  ASA: III  Anesthesia Plan: General LMA   Post-op Pain Management:    Induction: Intravenous  PONV Risk Score and Plan: 4 or greater and Ondansetron, Dexamethasone and Midazolam  Airway Management Planned: LMA  Additional Equipment:   Intra-op Plan:   Post-operative Plan: Extubation in OR  Informed Consent: I have reviewed the patients History and  Physical, chart, labs and discussed the procedure including the risks, benefits and alternatives for the proposed anesthesia with the patient or authorized representative who has indicated his/her understanding and acceptance.   Dental Advisory Given  Plan Discussed with: Anesthesiologist, CRNA and Surgeon  Anesthesia Plan Comments: (Patient consented for risks of anesthesia including but not limited to:  - adverse reactions to medications - damage to teeth, lips or other oral mucosa - sore throat or hoarseness - Damage to heart, brain, lungs or loss of life  Patient voiced understanding.)        Anesthesia Quick Evaluation

## 2017-10-08 ENCOUNTER — Encounter: Payer: Self-pay | Admitting: Orthopedic Surgery

## 2017-12-17 DIAGNOSIS — E785 Hyperlipidemia, unspecified: Secondary | ICD-10-CM | POA: Diagnosis not present

## 2017-12-17 DIAGNOSIS — E538 Deficiency of other specified B group vitamins: Secondary | ICD-10-CM | POA: Diagnosis not present

## 2017-12-17 DIAGNOSIS — E1165 Type 2 diabetes mellitus with hyperglycemia: Secondary | ICD-10-CM | POA: Diagnosis not present

## 2018-01-24 DIAGNOSIS — E538 Deficiency of other specified B group vitamins: Secondary | ICD-10-CM | POA: Diagnosis not present

## 2018-01-24 DIAGNOSIS — E785 Hyperlipidemia, unspecified: Secondary | ICD-10-CM | POA: Diagnosis not present

## 2018-01-24 DIAGNOSIS — E1165 Type 2 diabetes mellitus with hyperglycemia: Secondary | ICD-10-CM | POA: Diagnosis not present

## 2018-01-27 DIAGNOSIS — J0101 Acute recurrent maxillary sinusitis: Secondary | ICD-10-CM | POA: Diagnosis not present

## 2018-02-14 DIAGNOSIS — E538 Deficiency of other specified B group vitamins: Secondary | ICD-10-CM | POA: Diagnosis not present

## 2018-02-14 DIAGNOSIS — E785 Hyperlipidemia, unspecified: Secondary | ICD-10-CM | POA: Diagnosis not present

## 2018-02-14 DIAGNOSIS — E1165 Type 2 diabetes mellitus with hyperglycemia: Secondary | ICD-10-CM | POA: Diagnosis not present

## 2018-03-25 DIAGNOSIS — Z794 Long term (current) use of insulin: Secondary | ICD-10-CM | POA: Diagnosis not present

## 2018-03-25 DIAGNOSIS — E538 Deficiency of other specified B group vitamins: Secondary | ICD-10-CM | POA: Diagnosis not present

## 2018-03-25 DIAGNOSIS — E785 Hyperlipidemia, unspecified: Secondary | ICD-10-CM | POA: Diagnosis not present

## 2018-03-25 DIAGNOSIS — E1165 Type 2 diabetes mellitus with hyperglycemia: Secondary | ICD-10-CM | POA: Diagnosis not present

## 2018-05-06 DIAGNOSIS — Z0000 Encounter for general adult medical examination without abnormal findings: Secondary | ICD-10-CM | POA: Diagnosis not present

## 2018-05-06 DIAGNOSIS — E1165 Type 2 diabetes mellitus with hyperglycemia: Secondary | ICD-10-CM | POA: Diagnosis not present

## 2018-05-06 DIAGNOSIS — Z23 Encounter for immunization: Secondary | ICD-10-CM | POA: Diagnosis not present

## 2018-05-06 DIAGNOSIS — E782 Mixed hyperlipidemia: Secondary | ICD-10-CM | POA: Diagnosis not present

## 2018-05-09 ENCOUNTER — Other Ambulatory Visit: Payer: Self-pay | Admitting: Physician Assistant

## 2018-05-09 DIAGNOSIS — Z1231 Encounter for screening mammogram for malignant neoplasm of breast: Secondary | ICD-10-CM

## 2018-05-13 ENCOUNTER — Other Ambulatory Visit: Payer: Self-pay

## 2018-05-13 ENCOUNTER — Ambulatory Visit
Admission: EM | Admit: 2018-05-13 | Discharge: 2018-05-13 | Disposition: A | Discharge status: Home / Self Care | Payer: 59 | Attending: Family Medicine | Admitting: Family Medicine

## 2018-05-13 ENCOUNTER — Encounter: Payer: Self-pay | Admitting: Emergency Medicine

## 2018-05-13 DIAGNOSIS — R51 Headache: Secondary | ICD-10-CM | POA: Diagnosis not present

## 2018-05-13 DIAGNOSIS — R42 Dizziness and giddiness: Secondary | ICD-10-CM | POA: Diagnosis not present

## 2018-05-13 DIAGNOSIS — H8149 Vertigo of central origin, unspecified ear: Secondary | ICD-10-CM | POA: Diagnosis not present

## 2018-05-13 MED ORDER — ONDANSETRON 8 MG PO TBDP: 8.0000 mg | ORAL_TABLET | Freq: Two times a day (BID) | ORAL | 0 refills | Status: DC

## 2018-05-13 MED ORDER — DIAZEPAM 5 MG PO TABS: 5.0000 mg | ORAL_TABLET | Freq: Two times a day (BID) | ORAL | 0 refills | Status: DC

## 2018-05-13 NOTE — ED Triage Notes (Signed)
Patient in today c/o dizziness x 1 week. Patient states she had a vertigo "spell" 05/03/18. Patient states that ever since that she has had nausea and headache. Patient states she has Antivert and isn't able to take because it makes her "drunk". Patient states dizziness is worse when she lies down. She is sleeping in a recliner or propped up. Patient has called ENT, but they can't get her in until June 24.

## 2018-05-13 NOTE — ED Provider Notes (Signed)
[facility]    CSN: [id number] Arrival date & time: 05/13/18  4098     History   Chief Complaint Chief Complaint  Patient presents with  . Dizziness    HPI Joie Hipps is a 62 y.o. female.   HPI  62 year old female presents with dizziness that she has had for 1 week.  Started when she turned over in bed and felt the room spinning.  Was on 05/03/2018.  Since that she has had nausea and headache.  She took Antivert but it made her feel "drunk" and she has since stopped it.  The dizziness is worse when she lies down.  Because of this she is sleeping in a recliner or propped up.  Has appointment with an ENT but this is not until June 24.  States that she continues to have nausea and vomiting.  Denies any ear symptoms.  She has no other neurological symptoms.  Not had any syncope near syncope or any coordination problems.  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        Past Medical History:  Diagnosis Date  . Anxiety   . Complication of anesthesia   . Diabetes mellitus without complication (HCC)   . GERD (gastroesophageal reflux disease)   . High cholesterol   . History of hiatal hernia   . PONV (postoperative nausea and vomiting)     Patient Active Problem List   Diagnosis Date Noted  . 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 Depression 10/07/2017  . Diabetes mellitus type 2, uncomplicated (HCC) 10/07/2017  . Hiatal hernia 10/07/2017  . Hyperlipidemia, unspecified 10/07/2017  . Iron deficiency anemia 10/07/2017  . Obesity, unspecified 10/07/2017  . Pernicious anemia 10/07/2017  . Primary osteoarthritis of left knee 07/03/2017  . Gastroesophageal reflux disease without esophagitis 02/06/2017    Past Surgical History:  Procedure Laterality Date  . BREAST BIOPSY Left    neg- core  . CHONDROPLASTY  10/07/2017   Procedure: CHONDROPLASTY;  Surgeon: Donato Heinz, MD;  Location: [facility] ORS;  Service: Orthopedics;;  . KNEE ARTHROSCOPY WITH LATERAL  MENISECTOMY  10/07/2017   Procedure: KNEE ARTHROSCOPY WITH PARTIAL LATERAL MENISECTOMY;  Surgeon: Donato Heinz, MD;  Location: [facility] ORS;  Service: Orthopedics;;  . REDUCTION MAMMAPLASTY Bilateral    Many years ago 10+    OB History   None      Home Medications    Prior to Admission medications   Medication Sig Start Date End Date Taking? Authorizing Provider  dapagliflozin propanediol (FARXIGA) 5 MG TABS tablet Take 5 mg by mouth daily. In am   Yes [provider]  liraglutide (VICTOZA) 18 MG/3ML SOPN Inject into the skin every morning.    Yes [provider]  metFORMIN (GLUCOPHAGE) 1000 MG tablet Take 1,000 mg by mouth 2 (two) times daily with a meal.    Yes [provider]  omeprazole (PRILOSEC) 20 MG capsule Take 20 mg daily by mouth.   Yes [provider]  rosuvastatin (CRESTOR) 10 MG tablet Take 10 mg by mouth daily.   Yes [provider]  vitamin B-12 (CYANOCOBALAMIN) 1000 MCG tablet Take 1,000 mcg by mouth daily.   Yes [provider]  diazepam (VALIUM) 5 MG tablet Take 1 tablet (5 mg total) by mouth 2 (two) times daily. 05/13/18   Lutricia Feil, PA-C  ondansetron (ZOFRAN ODT) 8 MG disintegrating tablet Take 1 tablet (8 mg total) by mouth 2 (two) times daily. As necessary for nausea  or vomiting 05/13/18   Lutricia Feiloemer, Kenlyn Lose P, PA-C    Family History Family History  Problem Relation Age of Onset  . Healthy Mother   . Diabetes Father   . Breast cancer Neg Hx     Social History Social History   Tobacco Use  . Smoking status: Never Smoker  . Smokeless tobacco: Never Used  Substance Use Topics  . Alcohol use: No  . Drug use: No     Allergies   Augmentin [amoxicillin-pot clavulanate]   Review of Systems Review of Systems  Constitutional: Positive for activity change. Negative for chills, fatigue and fever.  HENT: Negative for ear discharge, ear pain, hearing loss, sinus pressure, sinus pain and tinnitus.     Eyes: Negative for photophobia and visual disturbance.  Neurological: Positive for dizziness and headaches. Negative for tremors, seizures, syncope, speech difficulty, weakness and numbness.  All other systems reviewed and are negative.    Physical Exam Triage Vital Signs ED Triage Vitals [05/13/18 0836]  Enc Vitals Group     BP (!) 147/65     Pulse Rate 70     Resp 16     Temp 98.1 F (36.7 C)     Temp Source Oral     SpO2 100 %     Weight 165 lb (74.8 kg)     Height 5\' 2"  (1.575 m)     Head Circumference      Peak Flow      Pain Score 7     Pain Loc      Pain Edu?      Excl. in GC?    No data found.  Updated Vital Signs BP (!) 147/65 (BP Location: Left Arm)   Pulse 70   Temp 98.1 F (36.7 C) (Oral)   Resp 16   Ht 5\' 2"  (1.575 m)   Wt 165 lb (74.8 kg)   SpO2 100%   BMI 30.18 kg/m   Visual Acuity Right Eye Distance:   Left Eye Distance:   Bilateral Distance:    Right Eye Near:   Left Eye Near:    Bilateral Near:     Physical Exam  Constitutional: She is oriented to person, place, and time. She appears well-developed and well-nourished. No distress.  HENT:  Head: Normocephalic and atraumatic.  Right Ear: External ear normal.  Left Ear: External ear normal.  Nose: Nose normal.  Mouth/Throat: Oropharynx is clear and moist. No oropharyngeal exudate.  Eyes: Pupils are equal, round, and reactive to light. Right eye exhibits no discharge. Left eye exhibits no discharge.  Patient does not have  nystagmus present.  Left pupil is dilated more than the right.  This is likely from the patient having blindness in that eye from childhood.  Neck: Normal range of motion.  Pulmonary/Chest: Effort normal and breath sounds normal.  Musculoskeletal: Normal range of motion.  Neurological: She is alert and oriented to person, place, and time. She displays normal reflexes. No cranial nerve deficit or sensory deficit. She exhibits normal muscle tone. Coordination normal.   Skin: Skin is warm and dry. She is not diaphoretic.  Psychiatric: She has a normal mood and affect. Her behavior is normal. Judgment and thought content normal.  Nursing note and vitals reviewed.    UC Treatments / Results  Labs (all labs ordered are listed, but only abnormal results are displayed) Labs Reviewed - No data to display  EKG None  Radiology No results found.  Procedures Procedures (including critical care  time)  Medications Ordered in UC Medications - No data to display  Initial Impression / Assessment and Plan / UC Course  I have reviewed the triage vital signs and the nursing notes.  Pertinent labs & imaging results that were available during my care of the patient were reviewed by me and considered in my medical decision making (see chart for details).     Plan: 1. Test/x-ray results and diagnosis reviewed with patient 2. rx as per orders; risks, benefits, potential side effects reviewed with patient 3. Recommend supportive treatment with Valium in place of the Antivert which causes her feeling drunk ness.  Explained to her the difference between peripheral and central vertigo.  At this point does not appear to be central.  However if she develops any change in her symptoms she should go to the emergency room.  Also recommended that she contact the ENT to see if there are any cancellations if she can be seen sooner. 4. F/u prn if symptoms worsen or don't improve  Final Clinical Impressions(s) / UC Diagnoses   Final diagnoses:  Vertigo     Discharge Instructions     If you have any change in your symptoms  or worsening go to the emergency room.    ED Prescriptions    Medication Sig Dispense Auth. Provider   diazepam (VALIUM) 5 MG tablet Take 1 tablet (5 mg total) by mouth 2 (two) times daily. 20 tablet Ovid Curd P, PA-C   ondansetron (ZOFRAN ODT) 8 MG disintegrating tablet Take 1 tablet (8 mg total) by mouth 2 (two) times daily. As necessary  for nausea or vomiting 12 tablet Lutricia Feil, PA-C     Controlled Substance Prescriptions [place] Controlled Substance Registry consulted? Not Applicable   Lutricia Feil, PA-C 05/13/18 2011

## 2018-05-13 NOTE — Discharge Instructions (Signed)
If you have any change in your symptoms  or worsening go to the emergency room.

## 2018-05-26 DIAGNOSIS — H8111 Benign paroxysmal vertigo, right ear: Secondary | ICD-10-CM | POA: Diagnosis not present

## 2018-05-26 DIAGNOSIS — H903 Sensorineural hearing loss, bilateral: Secondary | ICD-10-CM | POA: Diagnosis not present

## 2018-06-02 DIAGNOSIS — E113291 Type 2 diabetes mellitus with mild nonproliferative diabetic retinopathy without macular edema, right eye: Secondary | ICD-10-CM | POA: Diagnosis not present

## 2018-06-03 ENCOUNTER — Ambulatory Visit
Admission: RE | Admit: 2018-06-03 | Discharge: 2018-06-03 | Disposition: A | Discharge status: Home / Self Care | Payer: 59 | Source: Ambulatory Visit | Attending: Physician Assistant | Admitting: Physician Assistant

## 2018-06-03 DIAGNOSIS — Z1231 Encounter for screening mammogram for malignant neoplasm of breast: Secondary | ICD-10-CM | POA: Insufficient documentation

## 2018-06-04 DIAGNOSIS — H8111 Benign paroxysmal vertigo, right ear: Secondary | ICD-10-CM | POA: Diagnosis not present

## 2018-06-30 DIAGNOSIS — R634 Abnormal weight loss: Secondary | ICD-10-CM | POA: Diagnosis not present

## 2018-06-30 DIAGNOSIS — K2970 Gastritis, unspecified, without bleeding: Secondary | ICD-10-CM | POA: Diagnosis not present

## 2018-06-30 DIAGNOSIS — K219 Gastro-esophageal reflux disease without esophagitis: Secondary | ICD-10-CM | POA: Diagnosis not present

## 2018-06-30 DIAGNOSIS — Z86010 Personal history of colonic polyps: Secondary | ICD-10-CM | POA: Diagnosis not present

## 2018-06-30 DIAGNOSIS — Z794 Long term (current) use of insulin: Secondary | ICD-10-CM | POA: Diagnosis not present

## 2018-06-30 DIAGNOSIS — E1165 Type 2 diabetes mellitus with hyperglycemia: Secondary | ICD-10-CM | POA: Diagnosis not present

## 2018-07-18 DIAGNOSIS — E1165 Type 2 diabetes mellitus with hyperglycemia: Secondary | ICD-10-CM | POA: Diagnosis not present

## 2018-07-18 DIAGNOSIS — E538 Deficiency of other specified B group vitamins: Secondary | ICD-10-CM | POA: Diagnosis not present

## 2018-07-18 DIAGNOSIS — E785 Hyperlipidemia, unspecified: Secondary | ICD-10-CM | POA: Insufficient documentation

## 2018-07-18 DIAGNOSIS — Z794 Long term (current) use of insulin: Secondary | ICD-10-CM | POA: Diagnosis not present

## 2018-07-18 DIAGNOSIS — E1169 Type 2 diabetes mellitus with other specified complication: Secondary | ICD-10-CM | POA: Insufficient documentation

## 2018-08-26 DIAGNOSIS — Z0000 Encounter for general adult medical examination without abnormal findings: Secondary | ICD-10-CM | POA: Diagnosis not present

## 2018-08-26 DIAGNOSIS — E1165 Type 2 diabetes mellitus with hyperglycemia: Secondary | ICD-10-CM | POA: Diagnosis not present

## 2018-08-26 DIAGNOSIS — E782 Mixed hyperlipidemia: Secondary | ICD-10-CM | POA: Diagnosis not present

## 2018-08-29 DIAGNOSIS — E1169 Type 2 diabetes mellitus with other specified complication: Secondary | ICD-10-CM | POA: Diagnosis not present

## 2018-08-29 DIAGNOSIS — Z794 Long term (current) use of insulin: Secondary | ICD-10-CM | POA: Diagnosis not present

## 2018-08-29 DIAGNOSIS — E1165 Type 2 diabetes mellitus with hyperglycemia: Secondary | ICD-10-CM | POA: Diagnosis not present

## 2018-09-02 DIAGNOSIS — G8929 Other chronic pain: Secondary | ICD-10-CM | POA: Diagnosis not present

## 2018-09-02 DIAGNOSIS — M1712 Unilateral primary osteoarthritis, left knee: Secondary | ICD-10-CM | POA: Diagnosis not present

## 2018-09-02 DIAGNOSIS — M25562 Pain in left knee: Secondary | ICD-10-CM | POA: Diagnosis not present

## 2018-09-04 DIAGNOSIS — K5730 Diverticulosis of large intestine without perforation or abscess without bleeding: Secondary | ICD-10-CM | POA: Diagnosis not present

## 2018-09-04 DIAGNOSIS — K317 Polyp of stomach and duodenum: Secondary | ICD-10-CM | POA: Diagnosis not present

## 2018-09-04 DIAGNOSIS — K2950 Unspecified chronic gastritis without bleeding: Secondary | ICD-10-CM | POA: Diagnosis not present

## 2018-09-04 DIAGNOSIS — K228 Other specified diseases of esophagus: Secondary | ICD-10-CM | POA: Diagnosis not present

## 2018-09-04 DIAGNOSIS — Z86010 Personal history of colonic polyps: Secondary | ICD-10-CM | POA: Diagnosis not present

## 2018-09-04 DIAGNOSIS — K3189 Other diseases of stomach and duodenum: Secondary | ICD-10-CM | POA: Diagnosis not present

## 2018-09-04 DIAGNOSIS — K210 Gastro-esophageal reflux disease with esophagitis: Secondary | ICD-10-CM | POA: Diagnosis not present

## 2018-09-04 DIAGNOSIS — K219 Gastro-esophageal reflux disease without esophagitis: Secondary | ICD-10-CM | POA: Diagnosis not present

## 2018-09-04 DIAGNOSIS — K640 First degree hemorrhoids: Secondary | ICD-10-CM | POA: Diagnosis not present

## 2018-09-04 DIAGNOSIS — K449 Diaphragmatic hernia without obstruction or gangrene: Secondary | ICD-10-CM | POA: Diagnosis not present

## 2018-09-12 DIAGNOSIS — E1165 Type 2 diabetes mellitus with hyperglycemia: Secondary | ICD-10-CM | POA: Diagnosis not present

## 2018-09-12 DIAGNOSIS — Z23 Encounter for immunization: Secondary | ICD-10-CM | POA: Diagnosis not present

## 2018-09-12 DIAGNOSIS — E782 Mixed hyperlipidemia: Secondary | ICD-10-CM | POA: Diagnosis not present

## 2018-10-20 DIAGNOSIS — E538 Deficiency of other specified B group vitamins: Secondary | ICD-10-CM | POA: Diagnosis not present

## 2018-10-24 DIAGNOSIS — E1165 Type 2 diabetes mellitus with hyperglycemia: Secondary | ICD-10-CM | POA: Diagnosis not present

## 2018-10-24 DIAGNOSIS — E1169 Type 2 diabetes mellitus with other specified complication: Secondary | ICD-10-CM | POA: Diagnosis not present

## 2018-10-24 DIAGNOSIS — Z794 Long term (current) use of insulin: Secondary | ICD-10-CM | POA: Diagnosis not present

## 2018-11-06 DIAGNOSIS — M25551 Pain in right hip: Secondary | ICD-10-CM | POA: Diagnosis not present

## 2018-11-06 DIAGNOSIS — M25561 Pain in right knee: Secondary | ICD-10-CM | POA: Diagnosis not present

## 2018-11-06 DIAGNOSIS — M1711 Unilateral primary osteoarthritis, right knee: Secondary | ICD-10-CM | POA: Diagnosis not present

## 2018-11-18 DIAGNOSIS — M170 Bilateral primary osteoarthritis of knee: Secondary | ICD-10-CM | POA: Diagnosis not present

## 2018-11-25 DIAGNOSIS — M170 Bilateral primary osteoarthritis of knee: Secondary | ICD-10-CM | POA: Diagnosis not present

## 2018-12-02 DIAGNOSIS — M170 Bilateral primary osteoarthritis of knee: Secondary | ICD-10-CM | POA: Diagnosis not present

## 2018-12-19 DIAGNOSIS — Z794 Long term (current) use of insulin: Secondary | ICD-10-CM | POA: Diagnosis not present

## 2018-12-19 DIAGNOSIS — E1169 Type 2 diabetes mellitus with other specified complication: Secondary | ICD-10-CM | POA: Diagnosis not present

## 2018-12-19 DIAGNOSIS — E1165 Type 2 diabetes mellitus with hyperglycemia: Secondary | ICD-10-CM | POA: Diagnosis not present

## 2019-01-13 ENCOUNTER — Encounter: Payer: Self-pay | Admitting: Emergency Medicine

## 2019-01-13 ENCOUNTER — Other Ambulatory Visit: Payer: Self-pay

## 2019-01-13 ENCOUNTER — Ambulatory Visit (INDEPENDENT_AMBULATORY_CARE_PROVIDER_SITE_OTHER): Payer: 59

## 2019-01-13 ENCOUNTER — Ambulatory Visit
Admission: EM | Admit: 2019-01-13 | Discharge: 2019-01-13 | Disposition: A | Discharge status: Home / Self Care | Payer: 59 | Attending: Family Medicine | Admitting: Family Medicine

## 2019-01-13 DIAGNOSIS — S8392XA Sprain of unspecified site of left knee, initial encounter: Secondary | ICD-10-CM

## 2019-01-13 DIAGNOSIS — M25562 Pain in left knee: Secondary | ICD-10-CM

## 2019-01-13 DIAGNOSIS — Y999 Unspecified external cause status: Secondary | ICD-10-CM | POA: Diagnosis not present

## 2019-01-13 MED ORDER — HYDROCODONE-ACETAMINOPHEN 5-325 MG PO TABS: ORAL_TABLET | ORAL | 0 refills | Status: DC

## 2019-01-13 NOTE — ED Triage Notes (Signed)
Patient states that she was walking yesterday and felt a pop in her left knee.  Patient denies fall.

## 2019-01-13 NOTE — Discharge Instructions (Signed)
Rest, ice, elevation Follow up with orthopedist

## 2019-01-13 NOTE — ED Provider Notes (Signed)
` [facility]    CSN: [id number] Arrival date & time: 01/13/19  9233     History   Chief Complaint Chief Complaint  Patient presents with  . Knee Pain    left    HPI Erika Gutierrez is a 63 y.o. female.   The history is provided by the patient.  Knee Pain  Location:  Knee Injury: no (states she was walking yesterday and felt a sudden "pop" and pain ; denies any fall or other traumatic injury)   Knee location:  L knee Pain details:    Quality:  Aching Chronicity:  New (however patient states she has arthritis of that knee and has had meniscus injury; has had arthroscopic knee surgery; sees ortho) Dislocation: no   Foreign body present:  No foreign bodies Prior injury to area:  Yes Relieved by:  None tried Ineffective treatments:  None tried Associated symptoms: no back pain, no decreased ROM, no fatigue, no fever, no itching, no muscle weakness, no neck pain, no numbness, no stiffness, no swelling and no tingling     Past Medical History:  Diagnosis Date  . Anxiety   . Complication of anesthesia   . Diabetes mellitus without complication (HCC)   . GERD (gastroesophageal reflux disease)   . High cholesterol   . History of hiatal hernia   . PONV (postoperative nausea and vomiting)     Patient Active Problem List   Diagnosis Date Noted  . 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 Depression 10/07/2017  . Diabetes mellitus type 2, uncomplicated (HCC) 10/07/2017  . Hiatal hernia 10/07/2017  . Hyperlipidemia, unspecified 10/07/2017  . Iron deficiency anemia 10/07/2017  . Obesity, unspecified 10/07/2017  . Pernicious anemia 10/07/2017  . Primary osteoarthritis of left knee 07/03/2017  . Gastroesophageal reflux disease without esophagitis 02/06/2017    Past Surgical History:  Procedure Laterality Date  . BREAST BIOPSY Left 03/07/2015   neg- core fat necrosis   . CHONDROPLASTY  10/07/2017   Procedure: CHONDROPLASTY;  Surgeon: Donato Heinz, MD;   Location: [facility] ORS;  Service: Orthopedics;;  . KNEE ARTHROSCOPY WITH LATERAL MENISECTOMY  10/07/2017   Procedure: KNEE ARTHROSCOPY WITH PARTIAL LATERAL MENISECTOMY;  Surgeon: Donato Heinz, MD;  Location: [facility] ORS;  Service: Orthopedics;;  . REDUCTION MAMMAPLASTY Bilateral 1983    OB History   No obstetric history on file.      Home Medications    Prior to Admission medications   Medication Sig Start Date End Date Taking? Authorizing Provider  dapagliflozin propanediol (FARXIGA) 5 MG TABS tablet Take 5 mg by mouth daily. In am   Yes [provider]  metFORMIN (GLUCOPHAGE) 1000 MG tablet Take 1,000 mg by mouth 2 (two) times daily with a meal.    Yes [provider]  omeprazole (PRILOSEC) 20 MG capsule Take 20 mg daily by mouth.   Yes [provider]  rosuvastatin (CRESTOR) 10 MG tablet Take 10 mg by mouth daily.   Yes [provider]  Semaglutide, 1 MG/DOSE, 2 MG/1.5ML SOPN Inject into the skin. 10/24/18  Yes [provider]  vitamin B-12 (CYANOCOBALAMIN) 1000 MCG tablet Take 1,000 mcg by mouth daily.   Yes [provider]  diazepam (VALIUM) 5 MG tablet Take 1 tablet (5 mg total) by mouth 2 (two) times daily. 05/13/18   Lutricia Feil, PA-C  HYDROcodone-acetaminophen (NORCO/VICODIN) 5-325 MG tablet 1-2 tabs po bid prn 01/13/19   Payton Mccallum, MD  liraglutide (VICTOZA) 18 MG/3ML SOPN Inject into  the skin every morning.     [provider]  ondansetron (ZOFRAN ODT) 8 MG disintegrating tablet Take 1 tablet (8 mg total) by mouth 2 (two) times daily. As necessary for nausea or vomiting 05/13/18   Lutricia Feil, PA-C    Family History Family History  Problem Relation Age of Onset  . Healthy Mother   . Diabetes Father   . Breast cancer Neg Hx     Social History Social History   Tobacco Use  . Smoking status: Never Smoker  . Smokeless tobacco: Never Used  Substance Use Topics  . Alcohol use: No  . Drug use: No      Allergies   Augmentin [amoxicillin-pot clavulanate]   Review of Systems Review of Systems  Constitutional: Negative for fatigue and fever.  Musculoskeletal: Negative for back pain, neck pain and stiffness.  Skin: Negative for itching.     Physical Exam Triage Vital Signs ED Triage Vitals  Enc Vitals Group     BP 01/13/19 0831 (!) 141/82     Pulse Rate 01/13/19 0831 71     Resp 01/13/19 0831 14     Temp 01/13/19 0831 98.2 F (36.8 C)     Temp Source 01/13/19 0831 Oral     SpO2 01/13/19 0831 98 %     Weight 01/13/19 0829 165 lb (74.8 kg)     Height 01/13/19 0829 5\' 2"  (1.575 m)     Head Circumference --      Peak Flow --      Pain Score 01/13/19 0828 8     Pain Loc --      Pain Edu? --      Excl. in GC? --    No data found.  Updated Vital Signs BP (!) 141/82 (BP Location: Right Arm)   Pulse 71   Temp 98.2 F (36.8 C) (Oral)   Resp 14   Ht 5\' 2"  (1.575 m)   Wt 74.8 kg   SpO2 98%   BMI 30.18 kg/m   Visual Acuity Right Eye Distance:   Left Eye Distance:   Bilateral Distance:    Right Eye Near:   Left Eye Near:    Bilateral Near:     Physical Exam Vitals signs and nursing note reviewed.  Constitutional:      General: She is not in acute distress.    Appearance: She is not toxic-appearing or diaphoretic.  Musculoskeletal:     Left knee: She exhibits swelling (mild). She exhibits no effusion, no ecchymosis, no deformity, no laceration, no erythema, normal alignment, no LCL laxity, normal patellar mobility, no bony tenderness, normal meniscus and no MCL laxity. Tenderness (posterior) found.  Neurological:     Mental Status: She is alert.      UC Treatments / Results  Labs (all labs ordered are listed, but only abnormal results are displayed) Labs Reviewed - No data to display  EKG None  Radiology Dg Knee Complete 4 Views Left  Result Date: 01/13/2019 CLINICAL DATA:  Pt states she was walking down hall yesterday at work and left knee  popped. Immediately felt pain in post knee joint area. Arthroscopic surgery 1 year ago for torn meniscus. EXAM: LEFT KNEE - COMPLETE 4+ VIEW COMPARISON:  09/10/2017 MRI FINDINGS: There is significant tricompartmental degenerative change, particularly involving the MEDIAL compartment. No joint effusion. No acute fracture or subluxation. There is significant atherosclerotic calcification of the LOWER femoral artery and popliteal artery. IMPRESSION: 1. Significant degenerative changes. 2. No  evidence for acute abnormality. Electronically Signed   By: Norva PavlovElizabeth  Brown M.D.   On: 01/13/2019 09:30    Procedures Procedures (including critical care time)  Medications Ordered in UC Medications - No data to display  Initial Impression / Assessment and Plan / UC Course  I have reviewed the triage vital signs and the nursing notes.  Pertinent labs & imaging results that were available during my care of the patient were reviewed by me and considered in my medical decision making (see chart for details).      Final Clinical Impressions(s) / UC Diagnoses   Final diagnoses:  Sprain of left knee, unspecified ligament, initial encounter  Left knee pain, unspecified chronicity    ED Prescriptions    Medication Sig Dispense Auth. Provider   HYDROcodone-acetaminophen (NORCO/VICODIN) 5-325 MG tablet 1-2 tabs po bid prn 6 tablet Euell Schiff, Pamala Hurryrlando, MD     1. x-ray results and diagnosis reviewed with patient 2. rx as per orders above; reviewed possible side effects, interactions, risks and benefits  3. Recommend supportive treatment with rest, ice,elevation, otc analgesics prn 4. Follow-up with her orthopedist    Controlled Substance Prescriptions [place] Controlled Substance Registry consulted? Not Applicable   Payton Mccallumonty, Tacha Manni, MD 01/13/19 [phone]

## 2019-05-07 ENCOUNTER — Other Ambulatory Visit: Payer: Self-pay | Admitting: Physician Assistant

## 2019-05-07 DIAGNOSIS — Z1231 Encounter for screening mammogram for malignant neoplasm of breast: Secondary | ICD-10-CM

## 2019-06-23 ENCOUNTER — Ambulatory Visit
Admission: RE | Admit: 2019-06-23 | Discharge: 2019-06-23 | Disposition: A | Discharge status: Home / Self Care | Payer: BLUE CROSS/BLUE SHIELD | Source: Ambulatory Visit | Attending: Physician Assistant | Admitting: Physician Assistant

## 2019-06-23 ENCOUNTER — Other Ambulatory Visit: Payer: Self-pay

## 2019-06-23 DIAGNOSIS — Z1231 Encounter for screening mammogram for malignant neoplasm of breast: Secondary | ICD-10-CM

## 2019-09-11 ENCOUNTER — Other Ambulatory Visit: Payer: Self-pay | Admitting: Student

## 2019-09-11 DIAGNOSIS — M7582 Other shoulder lesions, left shoulder: Secondary | ICD-10-CM

## 2019-09-11 DIAGNOSIS — M899 Disorder of bone, unspecified: Secondary | ICD-10-CM

## 2019-09-28 IMAGING — CR DG KNEE COMPLETE 4+V*L*
4 series · 5 of 5 positions shown · non-contrast
Comparison: 09/10/2017 MRI

CLINICAL DATA: Pt states she was walking down hall yesterday at
work and left knee popped. Immediately felt pain in post knee joint
area. Arthroscopic surgery 1 year ago for torn meniscus.

EXAM:
LEFT KNEE - COMPLETE 4+ VIEW

[knee ap]
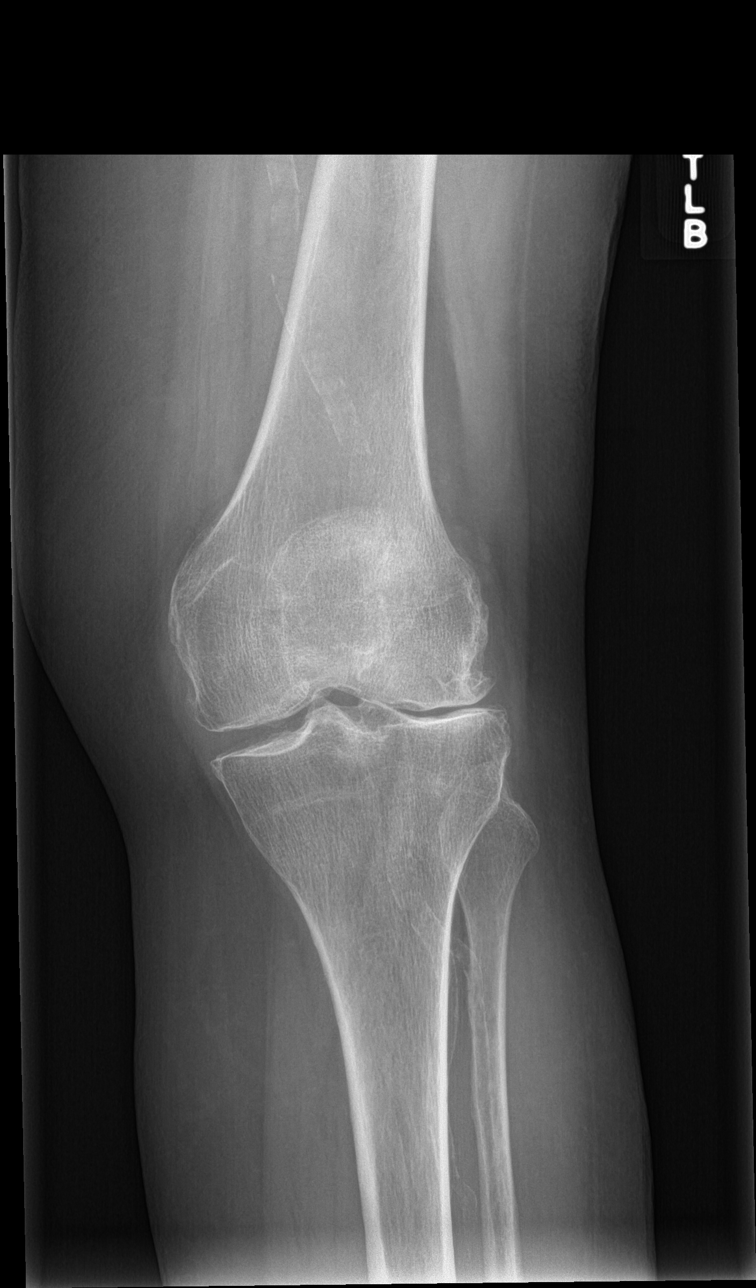

[knee lat]
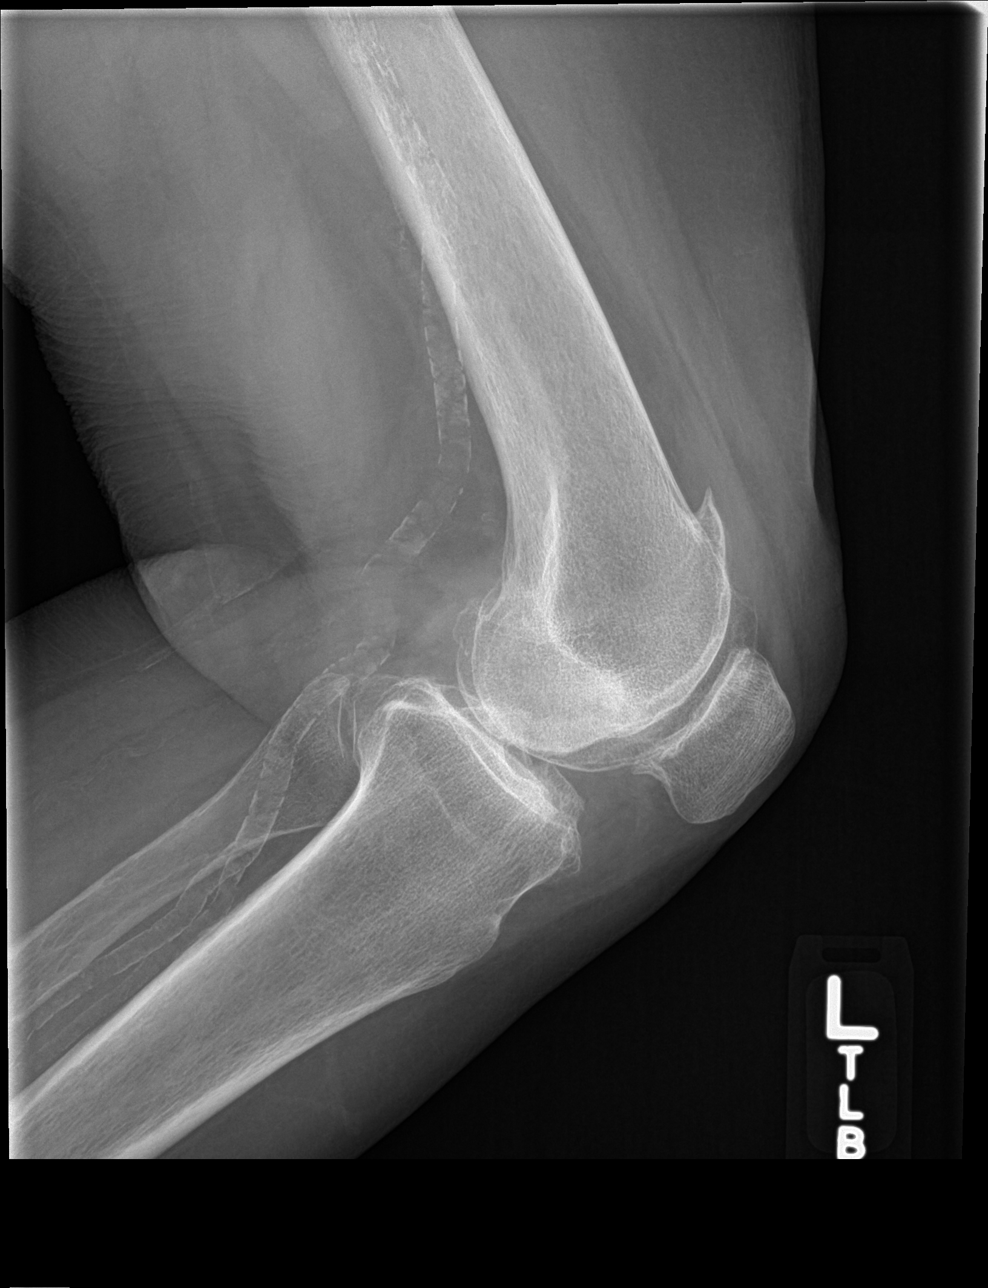

[Series 3: tunnel · 0.14mm/px · 2 of 2 slices shown]
[im 1/2]
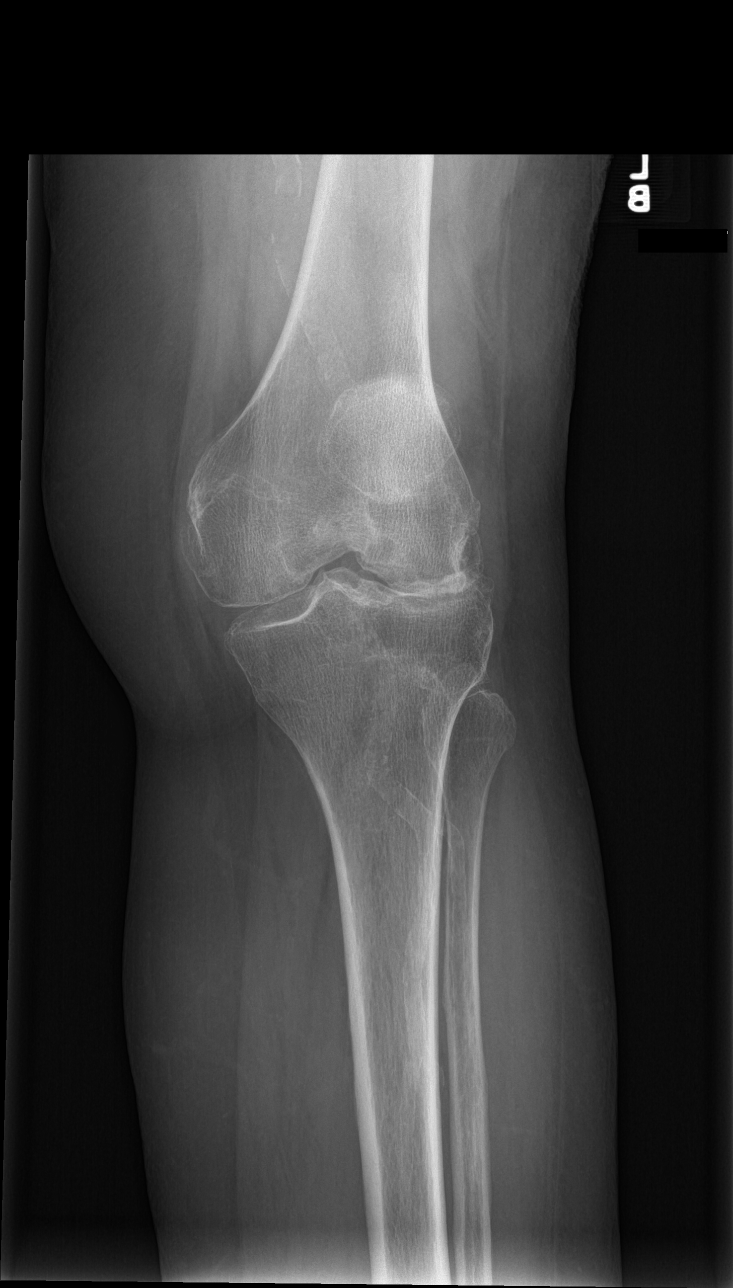
[im 2/2]
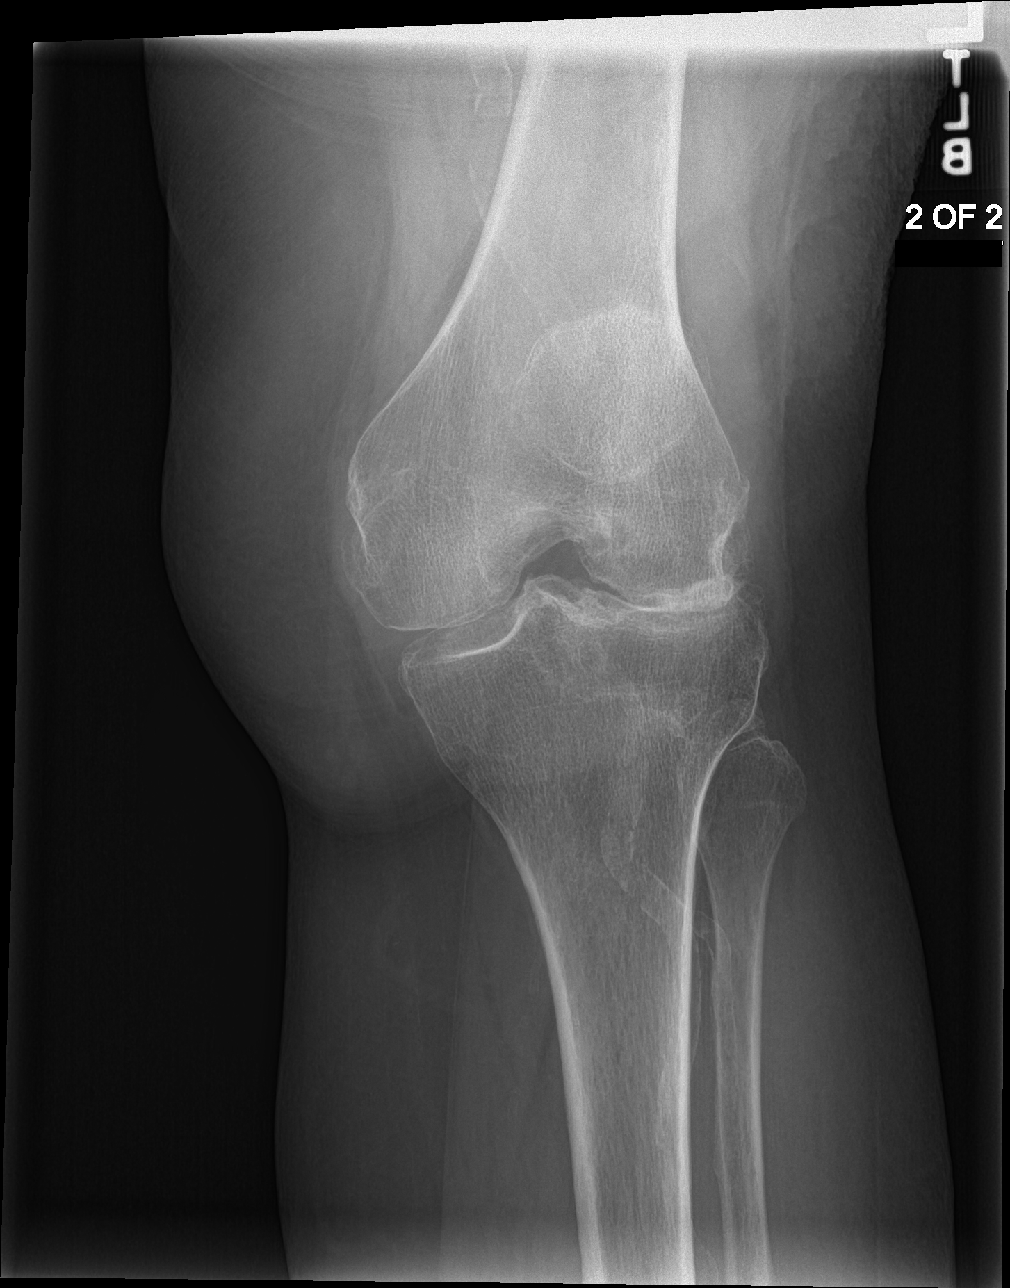

[patella skyline]
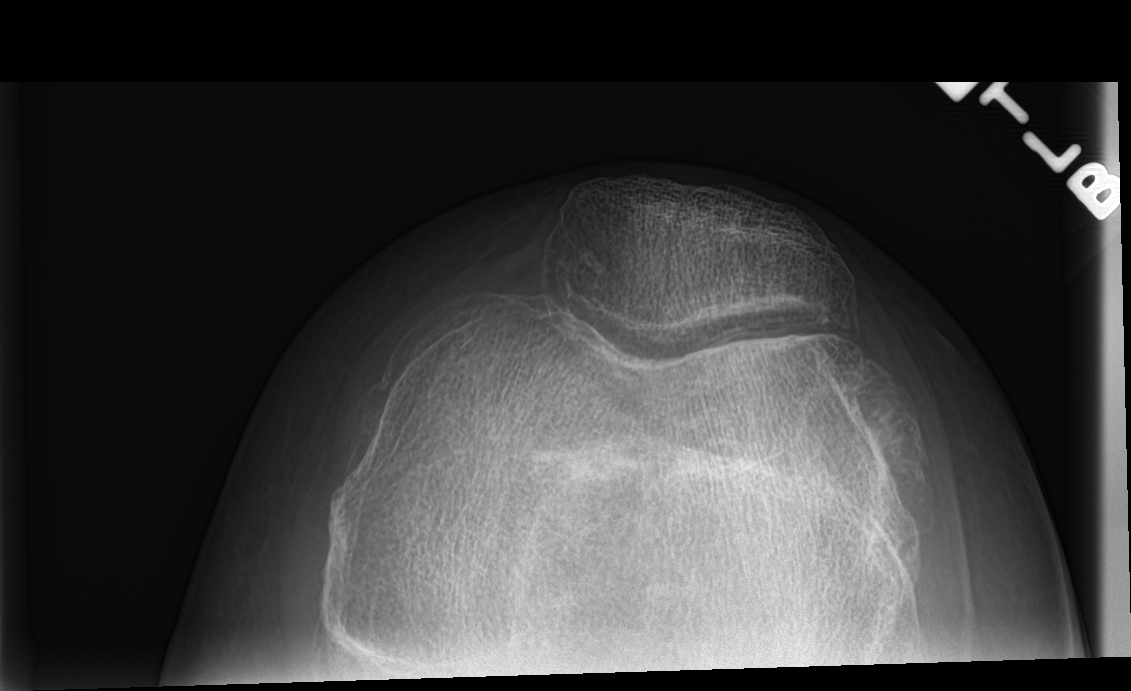

[5 of 5 positions shown; findings below may reference images not displayed]

FINDINGS: There is significant tricompartmental degenerative change,
particularly involving the MEDIAL compartment. No joint effusion. No
acute fracture or subluxation. There is significant atherosclerotic
calcification of the LOWER femoral artery and popliteal artery.
IMPRESSION: 1. Significant degenerative changes.
2. No evidence for acute abnormality.

## 2019-12-11 ENCOUNTER — Other Ambulatory Visit: Payer: Self-pay | Admitting: Student

## 2019-12-11 DIAGNOSIS — M899 Disorder of bone, unspecified: Secondary | ICD-10-CM

## 2019-12-11 DIAGNOSIS — M7582 Other shoulder lesions, left shoulder: Secondary | ICD-10-CM

## 2019-12-19 ENCOUNTER — Ambulatory Visit: Payer: BLUE CROSS/BLUE SHIELD

## 2019-12-23 ENCOUNTER — Ambulatory Visit: Payer: 59

## 2019-12-31 ENCOUNTER — Ambulatory Visit
Admission: RE | Admit: 2019-12-31 | Discharge: 2019-12-31 | Disposition: A | Discharge status: Home / Self Care | Payer: 59 | Source: Ambulatory Visit | Attending: Student | Admitting: Student

## 2019-12-31 ENCOUNTER — Other Ambulatory Visit: Payer: Self-pay

## 2019-12-31 DIAGNOSIS — M899 Disorder of bone, unspecified: Secondary | ICD-10-CM | POA: Diagnosis present

## 2019-12-31 DIAGNOSIS — M7582 Other shoulder lesions, left shoulder: Secondary | ICD-10-CM | POA: Insufficient documentation

## 2020-07-06 ENCOUNTER — Other Ambulatory Visit: Payer: Self-pay | Admitting: Physician Assistant

## 2020-07-06 DIAGNOSIS — Z1231 Encounter for screening mammogram for malignant neoplasm of breast: Secondary | ICD-10-CM

## 2020-07-25 ENCOUNTER — Ambulatory Visit
Admission: RE | Admit: 2020-07-25 | Discharge: 2020-07-25 | Disposition: A | Discharge status: Home / Self Care | Payer: 59 | Source: Ambulatory Visit | Attending: Physician Assistant | Admitting: Physician Assistant

## 2020-07-25 ENCOUNTER — Other Ambulatory Visit: Payer: Self-pay

## 2020-07-25 DIAGNOSIS — Z1231 Encounter for screening mammogram for malignant neoplasm of breast: Secondary | ICD-10-CM | POA: Diagnosis not present

## 2021-03-20 ENCOUNTER — Other Ambulatory Visit: Payer: Self-pay | Admitting: Gastroenterology

## 2021-03-20 ENCOUNTER — Other Ambulatory Visit (HOSPITAL_COMMUNITY): Payer: Self-pay | Admitting: Gastroenterology

## 2021-03-20 DIAGNOSIS — K219 Gastro-esophageal reflux disease without esophagitis: Secondary | ICD-10-CM

## 2021-03-20 DIAGNOSIS — R110 Nausea: Secondary | ICD-10-CM

## 2021-03-20 DIAGNOSIS — R1013 Epigastric pain: Secondary | ICD-10-CM

## 2021-04-07 ENCOUNTER — Ambulatory Visit: Payer: 59

## 2021-04-09 ENCOUNTER — Ambulatory Visit
Admission: EM | Admit: 2021-04-09 | Discharge: 2021-04-09 | Disposition: A | Discharge status: Home / Self Care | Payer: 59 | Attending: Medical Oncology | Admitting: Medical Oncology

## 2021-04-09 ENCOUNTER — Other Ambulatory Visit: Payer: Self-pay

## 2021-04-09 ENCOUNTER — Ambulatory Visit (INDEPENDENT_AMBULATORY_CARE_PROVIDER_SITE_OTHER): Payer: 59

## 2021-04-09 DIAGNOSIS — R1032 Left lower quadrant pain: Secondary | ICD-10-CM | POA: Diagnosis not present

## 2021-04-09 DIAGNOSIS — R109 Unspecified abdominal pain: Secondary | ICD-10-CM

## 2021-04-09 LAB — URINALYSIS, COMPLETE (UACMP) WITH MICROSCOPIC

## 2021-04-09 MED ORDER — TAMSULOSIN HCL 0.4 MG PO CAPS: 0.4000 mg | ORAL_CAPSULE | Freq: Every day | ORAL | 0 refills | Status: DC

## 2021-04-09 NOTE — Discharge Instructions (Addendum)
Referral has been placed to Urology. They should be calling you on Monday from [facility] office)- if not please give their office a call

## 2021-04-09 NOTE — ED Triage Notes (Addendum)
Pt c/o left flank pain over the past week. Pt did see her PCP and was advised her urine was clear based on culture, but was given ciprofloxacin. Pt states she has finished these and is not improved. Pt denies dysuria, but does report feeling some pressure. Pt denies f/n/v/d or other symptoms. Pt also has been taking urostat and this has not been helping either.

## 2021-04-09 NOTE — ED Provider Notes (Signed)
[facility]    CSN: [id number] Arrival date & time: 04/09/21  0836      History   Chief Complaint Chief Complaint  Patient presents with  . Flank Pain    left    HPI Circe Chilton is a 65 y.o. female.   HPI   Flank Pain: Pt reports left flank pain for the past week. She was seen by her PCP where, according to patient, her urine was "clear" but she was started on Cipro. She reports that she finished this medication but she is still having some flank pain and bladder pressure.She states that she is also taking Urostat without improvement. She denies fever, N/V/D.   Past Medical History:  Diagnosis Date  . Anxiety   . Complication of anesthesia   . Diabetes mellitus without complication (HCC)   . GERD (gastroesophageal reflux disease)   . High cholesterol   . History of hiatal hernia   . PONV (postoperative nausea and vomiting)     Patient Active Problem List   Diagnosis Date Noted  . 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 Depression 10/07/2017  . Diabetes mellitus type 2, uncomplicated (HCC) 10/07/2017  . Hiatal hernia 10/07/2017  . Hyperlipidemia, unspecified 10/07/2017  . Iron deficiency anemia 10/07/2017  . Obesity, unspecified 10/07/2017  . Pernicious anemia 10/07/2017  . Primary osteoarthritis of left knee 07/03/2017  . Gastroesophageal reflux disease without esophagitis 02/06/2017    Past Surgical History:  Procedure Laterality Date  . BREAST BIOPSY Left 03/07/2015   neg- core fat necrosis   . CHONDROPLASTY  10/07/2017   Procedure: CHONDROPLASTY;  Surgeon: Donato Heinz, MD;  Location: [facility];  Service: Orthopedics;;  . KNEE ARTHROSCOPY WITH LATERAL MENISECTOMY  10/07/2017   Procedure: KNEE ARTHROSCOPY WITH PARTIAL LATERAL MENISECTOMY;  Surgeon: Donato Heinz, MD;  Location: [facility] OR[facility];  Service: Orthopedics;;  . REDUCTION MAMMAPLASTY Bilateral 1983    OB History   No obstetric history on file.      Home Medications    Prior to  Admission medications   Medication Sig Start Date End Date Taking? Authorizing Provider  FLUoxetine (PROZAC) 20 MG capsule Take 20 mg by mouth daily. 03/07/21  Yes [provider]  metFORMIN (GLUCOPHAGE) 1000 MG tablet Take 1,000 mg by mouth 2 (two) times daily with a meal.    Yes [provider]  pantoprazole (PROTONIX) 40 MG tablet Take 1 tablet by mouth 2 (two) times daily. 03/14/21  Yes [provider]  rosuvastatin (CRESTOR) 10 MG tablet Take 10 mg by mouth daily.   Yes [provider]  Semaglutide, 1 MG/DOSE, 2 MG/1.5ML SOPN Inject into the skin. 10/24/18  Yes [provider]  sucralfate (CARAFATE) 1 g tablet Take 1 g by mouth 2 (two) times daily. 04/07/21  Yes [provider]  TRESIBA FLEXTOUCH 100 UNIT/ML FlexTouch Pen Inject into the skin. 03/27/21  Yes [provider]  vitamin B-12 (CYANOCOBALAMIN) 1000 MCG tablet Take 1,000 mcg by mouth daily.   Yes [provider]  dapagliflozin propanediol (FARXIGA) 5 MG TABS tablet Take 5 mg by mouth daily. In am    [provider]  diazepam (VALIUM) 5 MG tablet Take 1 tablet (5 mg total) by mouth 2 (two) times daily. 05/13/18   Lutricia Feil, PA-C  HYDROcodone-acetaminophen (NORCO/VICODIN) 5-325 MG tablet 1-2 tabs po bid prn 01/13/19   Payton Mccallum, MD  liraglutide (VICTOZA) 18 MG/3ML SOPN Inject into the skin every morning.     [provider]  omeprazole (PRILOSEC) 20 MG capsule Take 20 mg daily by mouth.    [provider]  ondansetron (ZOFRAN ODT) 8 MG disintegrating tablet Take 1 tablet (8 mg total) by mouth 2 (two) times daily. As necessary for nausea or vomiting 05/13/18   Lutricia Feil, PA-C    Family History Family History  Problem Relation Age of Onset  . Healthy Mother   . Diabetes Father   . Breast cancer Neg Hx     Social History Social History   Tobacco Use  . Smoking status: Never Smoker  . Smokeless tobacco: Never Used   Vaping Use  . Vaping Use: Never used  Substance Use Topics  . Alcohol use: No  . Drug use: No     Allergies   Sulfa antibiotics, Augmentin [amoxicillin-pot clavulanate], and Simvastatin   Review of Systems Review of Systems   Physical Exam Triage Vital Signs ED Triage Vitals  Enc Vitals Group     BP 04/09/21 0900 (!) 156/81     Pulse Rate 04/09/21 0900 65     Resp 04/09/21 0900 18     Temp 04/09/21 0900 98.2 F (36.8 C)     Temp Source 04/09/21 0900 Oral     SpO2 04/09/21 0900 98 %     Weight 04/09/21 0856 160 lb (72.6 kg)     Height 04/09/21 0856 5\' 2"  (1.575 m)     Head Circumference --      Peak Flow --      Pain Score 04/09/21 0856 3     Pain Loc --      Pain Edu? --      Excl. in GC? --    No data found.  Updated Vital Signs BP (!) 156/81 (BP Location: Left Arm)   Pulse 65   Temp 98.2 F (36.8 C) (Oral)   Resp 18   Ht 5\' 2"  (1.575 m)   Wt 160 lb (72.6 kg)   SpO2 98%   BMI 29.26 kg/m   Physical Exam Vitals and nursing note reviewed.  Constitutional:      General: She is not in acute distress.    Appearance: Normal appearance. She is not ill-appearing, toxic-appearing or diaphoretic.  Cardiovascular:     Rate and Rhythm: Normal rate and regular rhythm.     Heart sounds: Normal heart sounds.  Pulmonary:     Effort: Pulmonary effort is normal.     Breath sounds: Normal breath sounds.  Abdominal:     General: Bowel sounds are normal. There is no distension.     Palpations: Abdomen is soft. There is no mass.     Tenderness: There is no abdominal tenderness. There is no right CVA tenderness, left CVA tenderness, guarding or rebound.     Hernia: No hernia is present.  Lymphadenopathy:     Cervical: No cervical adenopathy.  Skin:    General: Skin is warm.     Coloration: Skin is not jaundiced.  Neurological:     Mental Status: She is alert.      UC Treatments / Results  Labs (all labs ordered are listed, but only abnormal results are  displayed) Labs Reviewed  URINALYSIS, COMPLETE (UACMP) WITH MICROSCOPIC    EKG   Radiology No results found.  Procedures Procedures (including critical care time)  Medications Ordered in UC Medications - No data to display  Initial Impression / Assessment and Plan / UC Course  I have reviewed the triage vital  signs and the nursing notes.  Pertinent labs & imaging results that were available during my care of the patient were reviewed by me and considered in my medical decision making (see chart for details).     New.  Obtained a KUB as we do not have access to an ultrasound today to look for potential kidney stones given her symptoms.  A 5 mm stone was seen on imaging.  I discussed this with patient.  We will start her on Flomax, hydration with water and I have placed an urgent referral to urology as she may or may not be able to pass this kidney stone.  Discussed red flag signs and symptoms. Final Clinical Impressions(s) / UC Diagnoses   Final diagnoses:  None   Discharge Instructions   None    ED Prescriptions    None     PDMP not reviewed this encounter.   Rushie Chestnut, [place] 04/09/21 1018

## 2021-04-25 ENCOUNTER — Encounter: Payer: Self-pay | Admitting: Urology

## 2021-04-25 ENCOUNTER — Other Ambulatory Visit: Payer: Self-pay

## 2021-04-25 ENCOUNTER — Ambulatory Visit (INDEPENDENT_AMBULATORY_CARE_PROVIDER_SITE_OTHER): Payer: 59 | Admitting: Urology

## 2021-04-25 ENCOUNTER — Other Ambulatory Visit
Admission: RE | Admit: 2021-04-25 | Discharge: 2021-04-25 | Disposition: A | Discharge status: Home / Self Care | Payer: 59 | Attending: Urology | Admitting: Urology

## 2021-04-25 VITALS — BP 167/100 | HR 74 | Ht 62.0 in | Wt 158.0 lb

## 2021-04-25 DIAGNOSIS — N200 Calculus of kidney: Secondary | ICD-10-CM

## 2021-04-25 LAB — URINALYSIS, COMPLETE (UACMP) WITH MICROSCOPIC
Glucose, UA: NEGATIVE mg/dL
Hgb urine dipstick: NEGATIVE
Nitrite: NEGATIVE
Protein, ur: 30 mg/dL — AB
Specific Gravity, Urine: 1.02 (ref 1.005–1.030)
pH: 5.5 (ref 5.0–8.0)

## 2021-04-25 NOTE — Patient Instructions (Addendum)
Dietary Guidelines to Help Prevent Kidney Stones Kidney stones are deposits of minerals and salts that form inside your kidneys. Your risk of developing kidney stones may be greater depending on your diet, your lifestyle, the medicines you take, and whether you have certain medical conditions. Most people can lower their chances of developing kidney stones by following the instructions below. Your dietitian may give you more specific instructions depending on your overall health and the type of kidney stones you tend to develop. What are tips for following this plan? Reading food labels  Choose foods with "no salt added" or "low-salt" labels. Limit your salt (sodium) intake to less than 1,500 mg a day.  Choose foods with calcium for each meal and snack. Try to eat about 300 mg of calcium at each meal. Foods that contain 200-500 mg of calcium a serving include: ? 8 oz (237 mL) of milk, calcium-fortifiednon-dairy milk, and calcium-fortifiedfruit juice. Calcium-fortified means that calcium has been added to these drinks. ? 8 oz (237 mL) of kefir, yogurt, and soy yogurt. ? 4 oz (114 g) of tofu. ? 1 oz (28 g) of cheese. ? 1 cup (150 g) of dried figs. ? 1 cup (91 g) of cooked broccoli. ? One 3 oz (85 g) can of sardines or mackerel. Most people need 1,000-1,500 mg of calcium a day. Talk to your dietitian about how much calcium is recommended for you.   Shopping  Buy plenty of fresh fruits and vegetables. Most people do not need to avoid fruits and vegetables, even if these foods contain nutrients that may contribute to kidney stones.  When shopping for convenience foods, choose: ? Whole pieces of fruit. ? Pre-made salads with dressing on the side. ? Low-fat fruit and yogurt smoothies.  Avoid buying frozen meals or prepared deli foods. These can be high in sodium.  Look for foods with live cultures, such as yogurt and kefir.  Choose high-fiber grains, such as whole-wheat breads, oat bran, and  wheat cereals. Cooking  Do not add salt to food when cooking. Place a salt shaker on the table and allow each person to add his or her own salt to taste.  Use vegetable protein, such as beans, textured vegetable protein (TVP), or tofu, instead of meat in pasta, casseroles, and soups. Meal planning  Eat less salt, if told by your dietitian. To do this: ? Avoid eating processed or pre-made food. ? Avoid eating fast food.  Eat less animal protein, including cheese, meat, poultry, or fish, if told by your dietitian. To do this: ? Limit the number of times you have meat, poultry, fish, or cheese each week. Eat a diet free of meat at least 2 days a week. ? Eat only one serving each day of meat, poultry, fish, or seafood. ? When you prepare animal protein, cut pieces into small portion sizes. For most meat and fish, one serving is about the size of the palm of your hand.  Eat at least five servings of fresh fruits and vegetables each day. To do this: ? Keep fruits and vegetables on hand for snacks. ? Eat one piece of fruit or a handful of berries with breakfast. ? Have a salad and fruit at lunch. ? Have two kinds of vegetables at dinner.  Limit foods that are high in a substance called oxalate. These include: ? Spinach (cooked), rhubarb, beets, sweet potatoes, and Swiss chard. ? Peanuts. ? Potato chips, french fries, and baked potatoes with skin on. ? Nuts and   nut products. ? Chocolate.  If you regularly take a diuretic medicine, make sure to eat at least 1 or 2 servings of fruits or vegetables that are high in potassium each day. These include: ? Avocado. ? Banana. ? Orange, prune, carrot, or tomato juice. ? Baked potato. ? Cabbage. ? Beans and split peas. Lifestyle  Drink enough fluid to keep your urine pale yellow. This is the most important thing you can do. Spread your fluid intake throughout the day.  If you drink alcohol: ? Limit how much you use to:  0-1 drink a day for  women who are not pregnant.  0-2 drinks a day for men. ? Be aware of how much alcohol is in your drink. In the [place] one drink equals one 12 oz bottle of beer (355 mL), one 5 oz glass of wine (148 mL), or one 1 oz glass of hard liquor (44 mL).  Lose weight if told by your health care provider. Work with your dietitian to find an eating plan and weight loss strategies that work best for you.   General information  Talk to your health care provider and dietitian about taking daily supplements. You may be told the following depending on your health and the cause of your kidney stones: ? Not to take supplements with vitamin C. ? To take a calcium supplement. ? To take a daily probiotic supplement. ? To take other supplements such as magnesium, fish oil, or vitamin B6.  Take over-the-counter and prescription medicines only as told by your health care provider. These include supplements. What foods should I limit? Limit your intake of the following foods, or eat them as told by your dietitian. Vegetables Spinach. Rhubarb. Beets. Canned vegetables. Rosita Fire. Olives. Baked potatoes with skin. Grains Wheat bran. Baked goods. Salted crackers. Cereals high in sugar. Meats and other proteins Nuts. Nut butters. Large portions of meat, poultry, or fish. Salted, precooked, or cured meats, such as sausages, meat loaves, and hot dogs. Dairy Cheese. Beverages Regular soft drinks. Regular vegetable juice. Seasonings and condiments Seasoning blends with salt. Salad dressings. Soy sauce. Ketchup. Barbecue sauce. Other foods Canned soups. Canned pasta sauce. Casseroles. Pizza. Lasagna. Frozen meals. Potato chips. [place] fries. The items listed above may not be a complete list of foods and beverages you should limit. Contact a dietitian for more information. What foods should I avoid? Talk to your dietitian about specific foods you should avoid based on the type of kidney stones you have and your overall  health. Fruits Grapefruit. The item listed above may not be a complete list of foods and beverages you should avoid. Contact a dietitian for more information. Summary  Kidney stones are deposits of minerals and salts that form inside your kidneys.  You can lower your risk of kidney stones by making changes to your diet.  The most important thing you can do is drink enough fluid. Drink enough fluid to keep your urine pale yellow.  Talk to your dietitian about how much calcium you should have each day, and eat less salt and animal protein as told by your dietitian. This information is not intended to replace advice given to you by your health care provider. Make sure you discuss any questions you have with your health care provider. Document Revised: 11/12/2019 Document Reviewed: 11/12/2019 Elsevier Patient Education  2021 Elsevier Inc.    Constipation, Adult Constipation is when a person has fewer than three bowel movements in a week, has difficulty having a bowel movement,  or has stools (feces) that are dry, hard, or larger than normal. Constipation may be caused by an underlying condition. It may become worse with age if a person takes certain medicines and does not take in enough fluids. Follow these instructions at home: Eating and drinking  Eat foods that have a lot of fiber, such as beans, whole grains, and fresh fruits and vegetables.  Limit foods that are low in fiber and high in fat and processed sugars, such as fried or sweet foods. These include french fries, hamburgers, cookies, candies, and soda.  Drink enough fluid to keep your urine pale yellow.   General instructions  Exercise regularly or as told by your health care provider. Try to do 150 minutes of moderate exercise each week.  Use the bathroom when you have the urge to go. Do not hold it in.  Take over-the-counter and prescription medicines only as told by your health care provider. This includes any fiber  supplements.  During bowel movements: ? Practice deep breathing while relaxing the lower abdomen. ? Practice pelvic floor relaxation.  Watch your condition for any changes. Let your health care provider know about them.  Keep all follow-up visits as told by your health care provider. This is important. Contact a health care provider if:  You have pain that gets worse.  You have a fever.  You do not have a bowel movement after 4 days.  You vomit.  You are not hungry or you lose weight.  You are bleeding from the opening between the buttocks (anus).  You have thin, pencil-like stools. Get help right away if:  You have a fever and your symptoms suddenly get worse.  You leak stool or have blood in your stool.  Your abdomen is bloated.  You have severe pain in your abdomen.  You feel dizzy or you faint. Summary  Constipation is when a person has fewer than three bowel movements in a week, has difficulty having a bowel movement, or has stools (feces) that are dry, hard, or larger than normal.  Eat foods that have a lot of fiber, such as beans, whole grains, and fresh fruits and vegetables.  Drink enough fluid to keep your urine pale yellow.  Take over-the-counter and prescription medicines only as told by your health care provider. This includes any fiber supplements. This information is not intended to replace advice given to you by your health care provider. Make sure you discuss any questions you have with your health care provider. Document Revised: 10/07/2019 Document Reviewed: 10/07/2019 Elsevier Patient Education  2021 [employer].

## 2021-04-25 NOTE — Progress Notes (Signed)
   04/25/21 10:24 AM   Cato Mulligan Shahin Jul 03, 1956 [id number]  CC: Left ureteral stone  HPI: I saw Ms. Scow for evaluation of a left ureteral stone.  She is a healthy 65 year old female with a history of nephrolithiasis previously requiring shockwave lithotripsy.  She was seen in urgent care on 04/09/2021 with abdominal pain and some urinary symptoms, and urinalysis showed microscopic hematuria but no infection, and KUB suggested a 5 mm left mid ureteral stone.  A few days later she passed the stone with complete resolution of her pain.   PMH: Past Medical History:  Diagnosis Date  . Anxiety   . Complication of anesthesia   . Diabetes mellitus without complication (HCC)   . GERD (gastroesophageal reflux disease)   . High cholesterol   . History of hiatal hernia   . PONV (postoperative nausea and vomiting)     Surgical History: Past Surgical History:  Procedure Laterality Date  . BREAST BIOPSY Left 03/07/2015   neg- core fat necrosis   . CHONDROPLASTY  10/07/2017   Procedure: CHONDROPLASTY;  Surgeon: Donato Heinz, MD;  Location: [facility] ORS;  Service: Orthopedics;;  . KNEE ARTHROSCOPY WITH LATERAL MENISECTOMY  10/07/2017   Procedure: KNEE ARTHROSCOPY WITH PARTIAL LATERAL MENISECTOMY;  Surgeon: Donato Heinz, MD;  Location: [facility] ORS;  Service: Orthopedics;;  . REDUCTION MAMMAPLASTY Bilateral 1983    Family History: Family History  Problem Relation Age of Onset  . Healthy Mother   . Diabetes Father   . Breast cancer Neg Hx     Social History:  reports that she has never smoked. She has never used smokeless tobacco. She reports that she does not drink alcohol and does not use drugs.  Physical Exam: BP (!) 167/100 (BP Location: Left Arm, Patient Position: Sitting, Cuff Size: Normal)   Pulse 74   Ht 5\' 2"  (1.575 m)   Wt 158 lb (71.7 kg)   BMI 28.90 kg/m    Constitutional:  Alert and oriented, No acute distress. Cardiovascular: No clubbing, cyanosis, or  edema. Respiratory: Normal respiratory effort, no increased work of breathing. GI: Abdomen is soft, nontender, nondistended, no abdominal masses  Laboratory Data: Reviewed  Pertinent Imaging: I have personally viewed and interpreted the KUB from 5/8 showing a 5 mm left mid ureteral stone.  Likely 3 mm right lower pole stone.  Assessment & Plan:   65 year old female with history of nephrolithiasis previously requiring shockwave lithotripsy, with recently spontaneously passed 5 mm left ureteral stone.  She also appeared to have significant constipation on the KUB, and I recommended MiraLAX and increased hydration.  We discussed general stone prevention strategies including adequate hydration with goal of producing 2.5 L of urine daily, increasing citric acid intake, increasing calcium intake during high oxalate meals, minimizing animal protein, and decreasing salt intake. Information about dietary recommendations given today.   RTC 1 year with KUB prior  65, MD 04/25/2021  [facility] [address], [address] [place], [place] [phone]

## 2021-05-18 ENCOUNTER — Encounter: Payer: Self-pay | Admitting: *Deleted

## 2021-05-19 ENCOUNTER — Ambulatory Visit: Payer: 59 | Admitting: Certified Registered"

## 2021-05-19 ENCOUNTER — Ambulatory Visit
Admission: RE | Admit: 2021-05-19 | Discharge: 2021-05-19 | Disposition: A | Discharge status: Home / Self Care | Payer: 59 | Attending: Gastroenterology | Admitting: Gastroenterology

## 2021-05-19 ENCOUNTER — Encounter
Admission: RE | Disposition: A | Discharge status: Home / Self Care | Payer: Self-pay | Source: Home / Self Care | Attending: Gastroenterology

## 2021-05-19 DIAGNOSIS — Z6828 Body mass index (BMI) 28.0-28.9, adult: Secondary | ICD-10-CM | POA: Diagnosis not present

## 2021-05-19 DIAGNOSIS — Z79899 Other long term (current) drug therapy: Secondary | ICD-10-CM | POA: Insufficient documentation

## 2021-05-19 DIAGNOSIS — Z7984 Long term (current) use of oral hypoglycemic drugs: Secondary | ICD-10-CM | POA: Diagnosis not present

## 2021-05-19 DIAGNOSIS — E119 Type 2 diabetes mellitus without complications: Secondary | ICD-10-CM | POA: Diagnosis not present

## 2021-05-19 DIAGNOSIS — Z880 Allergy status to penicillin: Secondary | ICD-10-CM | POA: Diagnosis not present

## 2021-05-19 DIAGNOSIS — E669 Obesity, unspecified: Secondary | ICD-10-CM | POA: Diagnosis not present

## 2021-05-19 DIAGNOSIS — Z794 Long term (current) use of insulin: Secondary | ICD-10-CM | POA: Insufficient documentation

## 2021-05-19 DIAGNOSIS — K219 Gastro-esophageal reflux disease without esophagitis: Secondary | ICD-10-CM | POA: Insufficient documentation

## 2021-05-19 DIAGNOSIS — K449 Diaphragmatic hernia without obstruction or gangrene: Secondary | ICD-10-CM | POA: Diagnosis not present

## 2021-05-19 DIAGNOSIS — Z882 Allergy status to sulfonamides status: Secondary | ICD-10-CM | POA: Diagnosis not present

## 2021-05-19 DIAGNOSIS — R1013 Epigastric pain: Secondary | ICD-10-CM | POA: Diagnosis present

## 2021-05-19 DIAGNOSIS — K317 Polyp of stomach and duodenum: Secondary | ICD-10-CM | POA: Diagnosis not present

## 2021-05-19 DIAGNOSIS — Z881 Allergy status to other antibiotic agents status: Secondary | ICD-10-CM | POA: Insufficient documentation

## 2021-05-19 DIAGNOSIS — K2970 Gastritis, unspecified, without bleeding: Secondary | ICD-10-CM | POA: Insufficient documentation

## 2021-05-19 HISTORY — PX: ESOPHAGOGASTRODUODENOSCOPY (EGD) WITH PROPOFOL: SHX5813

## 2021-05-19 HISTORY — DX: Blindness, one eye, unspecified eye: H54.40

## 2021-05-19 HISTORY — DX: Hyperlipidemia, unspecified: E78.5

## 2021-05-19 HISTORY — DX: Obesity, unspecified: E66.9

## 2021-05-19 HISTORY — DX: Anemia, unspecified: D64.9

## 2021-05-19 HISTORY — DX: Depression, unspecified: F32.A

## 2021-05-19 LAB — GLUCOSE, CAPILLARY: Glucose-Capillary: 174 mg/dL — ABNORMAL HIGH (ref 70–99)

## 2021-05-19 SURGERY — ESOPHAGOGASTRODUODENOSCOPY (EGD) WITH PROPOFOL
Anesthesia: General

## 2021-05-19 MED ORDER — PROPOFOL 10 MG/ML IV BOLUS
INTRAVENOUS | Status: DC | PRN
  Administered 2021-05-19: 50 mg via INTRAVENOUS

## 2021-05-19 MED ORDER — LIDOCAINE HCL (CARDIAC) PF 100 MG/5ML IV SOSY
PREFILLED_SYRINGE | INTRAVENOUS | Status: DC | PRN
  Administered 2021-05-19: 40 mg via INTRAVENOUS

## 2021-05-19 MED ORDER — PROPOFOL 500 MG/50ML IV EMUL
INTRAVENOUS | Status: DC | PRN
  Administered 2021-05-19: 100 ug/kg/min via INTRAVENOUS

## 2021-05-19 MED ORDER — SODIUM CHLORIDE 0.9 % IV SOLN
INTRAVENOUS | Status: DC
  Administered 2021-05-19: 1000 mL via INTRAVENOUS

## 2021-05-19 NOTE — Anesthesia Postprocedure Evaluation (Signed)
Anesthesia Post Note  Patient: Erika Gutierrez  Procedure(s) Performed: ESOPHAGOGASTRODUODENOSCOPY (EGD) WITH PROPOFOL  Patient location during evaluation: Endoscopy Anesthesia Type: General Level of consciousness: awake and alert and oriented Pain management: pain level controlled Vital Signs Assessment: post-procedure vital signs reviewed and stable Respiratory status: spontaneous breathing, nonlabored ventilation and respiratory function stable Cardiovascular status: blood pressure returned to baseline and stable Postop Assessment: no signs of nausea or vomiting Anesthetic complications: no   No notable events documented.   Last Vitals:  Vitals:   05/19/21 1113 05/19/21 1123  BP: (!) 145/79 (!) 153/66  Pulse:    Resp:    Temp:    SpO2:      Last Pain:  Vitals:   05/19/21 1123  PainSc: 0-No pain                 Shamon Cothran

## 2021-05-19 NOTE — Anesthesia Preprocedure Evaluation (Signed)
Anesthesia Evaluation  Patient identified by MRN, date of birth, ID band Patient awake    Reviewed: Allergy & Precautions, NPO status , Patient's Chart, lab work & pertinent test results  History of Anesthesia Complications (+) PONV and history of anesthetic complications  Airway Mallampati: II  TM Distance: >3 FB Neck ROM: Full    Dental no notable dental hx.    Pulmonary neg sleep apnea, neg COPD, former smoker,    breath sounds clear to auscultation- rhonchi (-) wheezing      Cardiovascular Exercise Tolerance: Good (-) hypertension(-) CAD, (-) Past MI, (-) Cardiac Stents and (-) CABG  Rhythm:Regular Rate:Normal - Systolic murmurs and - Diastolic murmurs    Neuro/Psych neg Seizures PSYCHIATRIC DISORDERS Anxiety Depression negative neurological ROS     GI/Hepatic Neg liver ROS, hiatal hernia, GERD  ,  Endo/Other  diabetes, Oral Hypoglycemic Agents  Renal/GU negative Renal ROS     Musculoskeletal  (+) Arthritis ,   Abdominal (+) - obese,   Peds  Hematology  (+) anemia ,   Anesthesia Other Findings Past Medical History: No date: Anemia No date: Anxiety 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 No date: Complication of anesthesia No date: Depression No date: Diabetes mellitus without complication (HCC) No date: GERD (gastroesophageal reflux disease) No date: High cholesterol No date: History of hiatal hernia No date: Hyperlipidemia No date: Obesity No date: PONV (postoperative nausea and vomiting)   Reproductive/Obstetrics                             Anesthesia Physical Anesthesia Plan  ASA: 2  Anesthesia Plan: General   Post-op Pain Management:    Induction: Intravenous  PONV Risk Score and Plan: 3 and Propofol infusion  Airway Management Planned: Natural Airway  Additional Equipment:   Intra-op Plan:   Post-operative Plan:   Informed Consent: I have reviewed the patients History  and Physical, chart, labs and discussed the procedure including the risks, benefits and alternatives for the proposed anesthesia with the patient or authorized representative who has indicated his/her understanding and acceptance.     Dental advisory given  Plan Discussed with: CRNA and Anesthesiologist  Anesthesia Plan Comments:         Anesthesia Quick Evaluation

## 2021-05-19 NOTE — Transfer of Care (Signed)
Immediate Anesthesia Transfer of Care Note  Patient: Erika Gutierrez  Procedure(s) Performed: ESOPHAGOGASTRODUODENOSCOPY (EGD) WITH PROPOFOL  Patient Location: PACU  Anesthesia Type:General  Level of Consciousness: awake  Airway & Oxygen Therapy: Patient Spontanous Breathing  Post-op Assessment: Report given to RN and Post -op Vital signs reviewed and stable  Post vital signs: stable  Last Vitals:  Vitals Value Taken Time  BP 139/75 05/19/21 1053  Temp 36 C 05/19/21 1053  Pulse 62 05/19/21 1053  Resp 13 05/19/21 1053  SpO2 96 % 05/19/21 1053  Vitals shown include unvalidated device data.  Last Pain:  Vitals:   05/19/21 1053  PainSc: 0-No pain         Complications: No notable events documented.

## 2021-05-19 NOTE — Interval H&P Note (Signed)
History and Physical Interval Note:  05/19/2021 10:36 AM  Erika Gutierrez  has presented today for surgery, with the diagnosis of GERD,DYSPEPSIA.  The various methods of treatment have been discussed with the patient and family. After consideration of risks, benefits and other options for treatment, the patient has consented to  Procedure(s): ESOPHAGOGASTRODUODENOSCOPY (EGD) WITH PROPOFOL (N/A) as a surgical intervention.  The patient's history has been reviewed, patient examined, no change in status, stable for surgery.  I have reviewed the patient's chart and labs.  Questions were answered to the patient's satisfaction.     Regis Bill  Ok to proceed with EGD

## 2021-05-19 NOTE — H&P (Signed)
Outpatient short stay form Pre-procedure 05/19/2021 10:33 AM Raylene Miyamoto MD, MPH  Primary Physician: Dr. Carrie Mew  Reason for visit:  Vomiting  History of present illness:   65 y/o lady with DM II and history of GERD and dyspepsia here for EGD for chronic vomiting. No blood thinners. No family history of GI malignancies. Symptoms come and go.    Current Facility-Administered Medications:    0.9 %  sodium chloride infusion, , Intravenous, Continuous, Marcell Chavarin, Hilton Cork, MD, Last Rate: 20 mL/hr at 05/19/21 1032, Continued from Pre-op at 05/19/21 1032  Medications Prior to Admission  Medication Sig Dispense Refill Last Dose   Continuous Blood Gluc Receiver (FREESTYLE LIBRE 14 DAY READER) DEVI Use 1 kit as directed E11.65   05/19/2021   Continuous Blood Gluc Sensor (FREESTYLE LIBRE 14 DAY SENSOR) MISC Apply topically.   05/19/2021   FLUoxetine (PROZAC) 20 MG capsule Take 20 mg by mouth daily.   05/18/2021   metFORMIN (GLUCOPHAGE) 1000 MG tablet Take 1,000 mg by mouth 2 (two) times daily with a meal.    05/18/2021   omeprazole (PRILOSEC) 20 MG capsule Take 20 mg daily by mouth.   05/18/2021   ondansetron (ZOFRAN) 4 MG tablet Take 4 mg by mouth every 8 (eight) hours as needed for nausea or vomiting.   prn   ondansetron (ZOFRAN-ODT) 8 MG disintegrating tablet Take 8 mg by mouth every 8 (eight) hours as needed for nausea or vomiting.      OZEMPIC, 1 MG/DOSE, 4 MG/3ML SOPN SMARTSIG:0.75 Milliliter(s) SUB-Q Once a Week   05/18/2021   rosuvastatin (CRESTOR) 10 MG tablet Take 10 mg by mouth daily.   05/18/2021   TRESIBA FLEXTOUCH 100 UNIT/ML FlexTouch Pen Inject into the skin.   05/18/2021   vitamin B-12 (CYANOCOBALAMIN) 1000 MCG tablet Take 1,000 mcg by mouth daily.   05/18/2021   dapagliflozin propanediol (FARXIGA) 5 MG TABS tablet Take 5 mg by mouth daily. In am (Patient not taking: Reported on 05/19/2021)   Not Taking   pantoprazole (PROTONIX) 40 MG tablet Take 1 tablet by mouth 2 (two) times  daily. (Patient not taking: Reported on 05/19/2021)   Not Taking   sucralfate (CARAFATE) 1 g tablet Take 1 g by mouth 2 (two) times daily. (Patient not taking: Reported on 05/19/2021)   Not Taking   tamsulosin (FLOMAX) 0.4 MG CAPS capsule Take 1 capsule (0.4 mg total) by mouth daily after supper. (Patient not taking: Reported on 05/19/2021) 30 capsule 0 Not Taking     Allergies  Allergen Reactions   Sulfa Antibiotics Other (See Comments)    constipation   Augmentin [Amoxicillin-Pot Clavulanate] Other (See Comments)    constipation   Simvastatin     Other reaction(s): Muscle Pain     Past Medical History:  Diagnosis Date   Anemia    Anxiety    Blind left eye    Complication of anesthesia    Depression    Diabetes mellitus without complication (HCC)    GERD (gastroesophageal reflux disease)    High cholesterol    History of hiatal hernia    Hyperlipidemia    Obesity    PONV (postoperative nausea and vomiting)     Review of systems:  Otherwise negative.    Physical Exam  Gen: Alert, oriented. Appears stated age.  HEENT: PERRLA. Lungs: No respiratory distress CV: RRR Abd: soft, benign, no masses Ext: No edema    Planned procedures: Proceed with EGD. The patient understands the nature of the planned  procedure, indications, risks, alternatives and potential complications including but not limited to bleeding, infection, perforation, damage to internal organs and possible oversedation/side effects from anesthesia. The patient agrees and gives consent to proceed.  Please refer to procedure notes for findings, recommendations and patient disposition/instructions.     Raylene Miyamoto MD, MPH Gastroenterology 05/19/2021  10:33 AM

## 2021-05-19 NOTE — Op Note (Signed)
[facility] Gastroenterology Patient Name: Erika Gutierrez Procedure Date: 05/19/2021 10:37 AM MRN: [id number] Account #: [id number] Date of Birth: [birth year] Admit Type: Outpatient Age: 65 Room: [facility] ENDO ROOM 3 Gender: Female Note Status: Finalized Procedure:             Upper GI endoscopy Indications:           Dyspepsia, Gastro-esophageal reflux disease, Nausea                         with vomiting Providers:             Andrey Farmer MD, MD Referring MD:          Precious Bard, MD (Referring MD) Medicines:             Monitored Anesthesia Care Complications:         No immediate complications. Estimated blood loss:                         Minimal. Procedure:             Pre-Anesthesia Assessment:                        - Prior to the procedure, a History and Physical was                         performed, and patient medications and allergies were                         reviewed. The patient is competent. The risks and                         benefits of the procedure and the sedation options and                         risks were discussed with the patient. All questions                         were answered and informed consent was obtained.                         Patient identification and proposed procedure were                         verified by the physician, the nurse, the anesthetist                         and the technician in the endoscopy suite. Mental                         Status Examination: alert and oriented. Airway                         Examination: normal oropharyngeal airway and neck                         mobility. Respiratory Examination: clear to  auscultation. CV Examination: normal. Prophylactic                         Antibiotics: The patient does not require prophylactic                         antibiotics. Prior Anticoagulants: The patient has                         taken no previous  anticoagulant or antiplatelet                         agents. ASA Grade Assessment: II - A patient with mild                         systemic disease. After reviewing the risks and                         benefits, the patient was deemed in satisfactory                         condition to undergo the procedure. The anesthesia                         plan was to use monitored anesthesia care (MAC).                         Immediately prior to administration of medications,                         the patient was re-assessed for adequacy to receive                         sedatives. The heart rate, respiratory rate, oxygen                         saturations, blood pressure, adequacy of pulmonary                         ventilation, and response to care were monitored                         throughout the procedure. The physical status of the                         patient was re-assessed after the procedure.                        After obtaining informed consent, the endoscope was                         passed under direct vision. Throughout the procedure,                         the patient's blood pressure, pulse, and oxygen                         saturations were monitored continuously. The Endoscope  was introduced through the mouth, and advanced to the                         second part of duodenum. The upper GI endoscopy was                         accomplished without difficulty. The patient tolerated                         the procedure well. Findings:      A 4 cm hiatal hernia was present.      The exam of the esophagus was otherwise normal.      Patchy mild inflammation characterized by erythema was found in the       gastric antrum. Biopsies were taken with a cold forceps for histology.       Estimated blood loss was minimal.      Multiple 2 to 10 mm sessile fundic gland polyps with no bleeding and no       stigmata of recent bleeding were found in  the gastric body. Biopsies       were taken with a cold forceps for histology. Estimated blood loss was       minimal.      The examined duodenum was normal. Impression:            - 4 cm hiatal hernia.                        - Gastritis. Biopsied.                        - Multiple fundic gland polyps. Biopsied.                        - Normal examined duodenum. Recommendation:        - Discharge patient to home.                        - Resume previous diet.                        - Continue present medications.                        - Await pathology results.                        - Return to referring physician as previously                         scheduled. Procedure Code(s):     --- Professional ---                        [phone], Esophagogastroduodenoscopy, flexible,                         transoral; with biopsy, single or multiple Diagnosis Code(s):     --- Professional ---                        K44.9, Diaphragmatic hernia without obstruction or  gangrene                        K29.70, Gastritis, unspecified, without bleeding                        K31.7, Polyp of stomach and duodenum                        R10.13, Epigastric pain                        K21.9, Gastro-esophageal reflux disease without                         esophagitis                        R11.2, Nausea with vomiting, unspecified CPT copyright 2019 American Medical Association. All rights reserved. The codes documented in this report are preliminary and upon coder review may  be revised to meet current compliance requirements. Andrey Farmer MD, MD 05/19/2021 10:52:55 AM Number of Addenda: 0 Note Initiated On: 05/19/2021 10:37 AM Estimated Blood Loss:  Estimated blood loss was minimal.      [facility]

## 2021-05-22 ENCOUNTER — Encounter: Payer: Self-pay | Admitting: Gastroenterology

## 2021-05-24 LAB — SURGICAL PATHOLOGY

## 2021-05-26 ENCOUNTER — Ambulatory Visit: Payer: 59

## 2021-06-28 ENCOUNTER — Other Ambulatory Visit: Payer: Self-pay | Admitting: Physician Assistant

## 2021-06-28 ENCOUNTER — Other Ambulatory Visit: Payer: Self-pay

## 2021-06-28 DIAGNOSIS — Z1231 Encounter for screening mammogram for malignant neoplasm of breast: Secondary | ICD-10-CM

## 2021-07-15 ENCOUNTER — Other Ambulatory Visit: Payer: Self-pay

## 2021-07-15 ENCOUNTER — Ambulatory Visit
Admission: EM | Admit: 2021-07-15 | Discharge: 2021-07-15 | Disposition: A | Discharge status: Home / Self Care | Payer: 59 | Attending: Family Medicine | Admitting: Family Medicine

## 2021-07-15 ENCOUNTER — Encounter: Payer: Self-pay | Admitting: Emergency Medicine

## 2021-07-15 DIAGNOSIS — N200 Calculus of kidney: Secondary | ICD-10-CM | POA: Diagnosis not present

## 2021-07-15 LAB — POCT URINALYSIS DIP (DEVICE)
Bilirubin Urine: NEGATIVE
Glucose, UA: 250 mg/dL — AB
Ketones, ur: 15 mg/dL — AB
Leukocytes,Ua: NEGATIVE
Nitrite: POSITIVE — AB
Protein, ur: 30 mg/dL — AB
Specific Gravity, Urine: 1.025 (ref 1.005–1.030)
Urobilinogen, UA: 2 mg/dL — ABNORMAL HIGH (ref 0.0–1.0)
pH: 5.5 (ref 5.0–8.0)

## 2021-07-15 NOTE — ED Triage Notes (Signed)
Patient c/o right sided flank pain that started yesterday along with urinary frequency.  Patient states that she passed a kidney stone about 30 min ago.

## 2021-07-15 NOTE — Discharge Instructions (Signed)
Lots of water.  Read the dietary guidelines.  Call Urology office if you want the stone analyzed.  Take care  Dr. Adriana Simas

## 2021-07-15 NOTE — ED Provider Notes (Addendum)
[facility]    CSN: [id number] Arrival date & time: 07/15/21  0801      History   Chief Complaint Chief Complaint  Patient presents with   Flank Pain    right   Urinary Frequency    HPI 65 year old female presents with the above complaints.  Patient reports that her symptoms started yesterday.  She has had right-sided flank pain and urinary frequency.  She states that her pain was quite severe last night.  Interfered with sleep.  Pain is now improved as she passes stone approximately 30 minutes prior to arrival.  She has history of kidney stones.  Followed by urology.  She has brought the stone with her today.  No other complaints or concerns at this time.  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   Depression 10/07/2017   Diabetes mellitus type 2, uncomplicated ([place]) 67/89/3810   Hiatal hernia 10/07/2017   Hyperlipidemia, unspecified 10/07/2017   Iron deficiency anemia 10/07/2017   Obesity, unspecified 10/07/2017   Pernicious anemia 10/07/2017   Primary osteoarthritis of left knee 07/03/2017   Gastroesophageal reflux disease without esophagitis 02/06/2017    Past Surgical History:  Procedure Laterality Date   BREAST BIOPSY Left 03/07/2015   neg- core fat necrosis    CARDIAC CATHETERIZATION     CESAREAN SECTION     x3   CHONDROPLASTY  10/07/2017   Procedure: CHONDROPLASTY;  Surgeon: Dereck Leep, MD;  Location: [facility] ORS;  Service: Orthopedics;;    ESOPHAGOGASTRODUODENOSCOPY (EGD) WITH PROPOFOL N/A 05/19/2021   Procedure: ESOPHAGOGASTRODUODENOSCOPY (EGD) WITH PROPOFOL;  Surgeon: Lesly Rubenstein, MD;  Location: [facility] ENDOSCOPY;  Service: Endoscopy;  Laterality: N/A;   KNEE ARTHROSCOPY WITH LATERAL MENISECTOMY  10/07/2017   Procedure: KNEE ARTHROSCOPY WITH PARTIAL LATERAL MENISECTOMY;  Surgeon: Dereck Leep, MD;  Location: [facility] ORS;  Service: Orthopedics;;   REDUCTION MAMMAPLASTY Bilateral 1983   thumb trigger release Right    TUBAL LIGATION      OB History   No obstetric history on file.      Home Medications    Prior to Admission medications   Medication Sig Start Date End Date Taking? Authorizing Provider  FLUoxetine (PROZAC) 20 MG capsule Take 20 mg by mouth daily. 03/07/21  Yes [provider]  metFORMIN (GLUCOPHAGE) 1000 MG tablet Take 1,000 mg by mouth 2 (two) times daily with a meal.    Yes [provider]  OZEMPIC, 1 MG/DOSE, 4 MG/3ML SOPN SMARTSIG:0.75 Milliliter(s) SUB-Q Once a Week 02/06/21  Yes [provider]  pantoprazole (PROTONIX) 40 MG tablet Take 1 tablet by mouth 2 (two) times daily. 03/14/21  Yes [provider]  rosuvastatin (CRESTOR) 10 MG tablet Take 10 mg by mouth daily.   Yes [provider]  TRESIBA FLEXTOUCH 100 UNIT/ML FlexTouch Pen Inject into the skin. 03/27/21  Yes [provider]  vitamin B-12 (CYANOCOBALAMIN) 1000 MCG tablet Take 1,000 mcg  by mouth daily.   Yes [provider]  Continuous Blood Gluc Receiver (FREESTYLE LIBRE 14 DAY READER) DEVI Use 1 kit as directed E11.65 02/17/18   [provider]  Continuous Blood Gluc Sensor (FREESTYLE LIBRE 14 DAY SENSOR) MISC Apply topically. 04/08/21   [provider]  ondansetron (ZOFRAN) 4 MG tablet Take 4 mg by mouth every 8 (eight) hours as needed for nausea or vomiting.    [provider]  ondansetron (ZOFRAN-ODT) 8 MG disintegrating tablet Take 8 mg by mouth every 8  (eight) hours as needed for nausea or vomiting.    [provider]    Family History Family History  Problem Relation Age of Onset   Healthy Mother    Diabetes Father    Breast cancer Neg Hx     Social History Social History   Tobacco Use   Smoking status: Former    Packs/day: 0.25    Years: 3.00    Pack years: 0.75    Types: Cigarettes    Quit date: 1980    Years since quitting: 42.6   Smokeless tobacco: Never  Vaping Use   Vaping Use: Never used  Substance Use Topics   Alcohol use: No   Drug use: No     Allergies   Sulfa antibiotics, Augmentin [amoxicillin-pot clavulanate], and Simvastatin   Review of Systems Review of Systems Per HPI  Physical Exam Triage Vital Signs ED Triage Vitals  Enc Vitals Group     BP 07/15/21 0815 (!) 153/77     Pulse Rate 07/15/21 0815 74     Resp 07/15/21 0815 14     Temp 07/15/21 0815 98.3 F (36.8 C)     Temp Source 07/15/21 0815 Oral     SpO2 07/15/21 0815 98 %     Weight 07/15/21 0811 155 lb (70.3 kg)     Height 07/15/21 0811 $RemoveBefor'5\' 2"'NyFXcFSZkLql$  (1.575 m)     Head Circumference --      Peak Flow --      Pain Score 07/15/21 0810 4     Pain Loc --      Pain Edu? --      Excl. in [place]? --    Updated Vital Signs BP (!) 153/77 (BP Location: Right Arm)   Pulse 74   Temp 98.3 F (36.8 C) (Oral)   Resp 14   Ht $R'5\' 2"'WH$  (1.575 m)   Wt 70.3 kg   SpO2 98%   BMI 28.35 kg/m   Visual Acuity Right Eye Distance:   Left Eye Distance:   Bilateral Distance:    Right Eye Near:   Left Eye Near:    Bilateral Near:     Physical Exam Vitals and nursing note reviewed.  Constitutional:      General: She is not in acute distress.    Appearance: Normal appearance. She is not ill-appearing.  HENT:     Head: Normocephalic and atraumatic.  Eyes:     General:        Right eye: No discharge.        Left eye: No discharge.     Conjunctiva/sclera: Conjunctivae normal.  Cardiovascular:     Rate and Rhythm: Normal rate and regular  rhythm.  Pulmonary:     Effort: Pulmonary effort is normal.     Breath sounds: Normal breath sounds. No wheezing, rhonchi or rales.  Abdominal:     Tenderness: There is no right CVA tenderness or left CVA tenderness.  Neurological:  Mental Status: She is alert.  Psychiatric:        Mood and Affect: Mood normal.        Behavior: Behavior normal.    UC Treatments / Results  Labs (all labs ordered are listed, but only abnormal results are displayed) Labs Reviewed  POCT URINALYSIS DIP (DEVICE) - Abnormal; Notable for the following components:      Result Value   Glucose, UA 250 (*)    Ketones, ur 15 (*)    Hgb urine dipstick LARGE (*)    Protein, ur 30 (*)    Urobilinogen, UA 2.0 (*)    Nitrite POSITIVE (*)    All other components within normal limits  URINE CULTURE  POCT URINALYSIS DIPSTICK, ED / UC    EKG   Radiology No results found.  Procedures Procedures (including critical care time)  Medications Ordered in UC Medications - No data to display  Initial Impression / Assessment and Plan / UC Course  I have reviewed the triage vital signs and the nursing notes.  Pertinent labs & imaging results that were available during my care of the patient were reviewed by me and considered in my medical decision making (see chart for details).    65 year old female presents with nephrolithiasis.  Urinalysis was reviewed.  Her urine is markedly abnormal.  I suspect that the patient has taken Azo.  Awaiting culture.  Her symptoms have currently improved.  Has passed the stone without intervention.  She is feeling improved.  Advised to review dietary handout given.  Advised to increase fluid intake.  Patient does not drink much water at all.  I advised her to call the urology office if she desires analysis of the stone.  Supportive care.  Final Clinical Impressions(s) / UC Diagnoses   Final diagnoses:  Nephrolithiasis     Discharge Instructions      Lots of  water.  Read the dietary guidelines.  Call Urology office if you want the stone analyzed.  Take care  Dr. Lacinda Axon      ED Prescriptions   None    PDMP not reviewed this encounter.    Thersa [place], [place] 07/15/21 [phone]

## 2021-07-17 LAB — URINE CULTURE

## 2021-07-26 ENCOUNTER — Other Ambulatory Visit: Payer: Self-pay

## 2021-07-26 ENCOUNTER — Ambulatory Visit
Admission: RE | Admit: 2021-07-26 | Discharge: 2021-07-26 | Disposition: A | Discharge status: Home / Self Care | Payer: 59 | Source: Ambulatory Visit | Attending: Physician Assistant | Admitting: Physician Assistant

## 2021-07-26 DIAGNOSIS — Z1231 Encounter for screening mammogram for malignant neoplasm of breast: Secondary | ICD-10-CM | POA: Insufficient documentation

## 2021-08-01 ENCOUNTER — Ambulatory Visit: Payer: 59 | Admitting: Urology

## 2021-08-08 ENCOUNTER — Ambulatory Visit: Payer: 59 | Admitting: Urology

## 2021-08-08 ENCOUNTER — Other Ambulatory Visit: Payer: Self-pay | Admitting: *Deleted

## 2021-08-08 DIAGNOSIS — N200 Calculus of kidney: Secondary | ICD-10-CM

## 2021-11-14 ENCOUNTER — Other Ambulatory Visit
Admission: RE | Admit: 2021-11-14 | Discharge: 2021-11-14 | Disposition: A | Discharge status: Home / Self Care | Payer: 59 | Source: Home / Self Care | Attending: Urology | Admitting: Urology

## 2021-11-14 ENCOUNTER — Ambulatory Visit
Admission: RE | Admit: 2021-11-14 | Discharge: 2021-11-14 | Disposition: A | Discharge status: Home / Self Care | Payer: 59 | Source: Ambulatory Visit | Attending: Urology | Admitting: Urology

## 2021-11-14 ENCOUNTER — Other Ambulatory Visit: Payer: Self-pay

## 2021-11-14 ENCOUNTER — Other Ambulatory Visit: Payer: Self-pay | Admitting: Urology

## 2021-11-14 ENCOUNTER — Other Ambulatory Visit: Payer: Self-pay | Admitting: *Deleted

## 2021-11-14 ENCOUNTER — Ambulatory Visit
Admission: RE | Admit: 2021-11-14 | Discharge: 2021-11-14 | Disposition: A | Discharge status: Home / Self Care | Payer: 59 | Attending: Urology | Admitting: Urology

## 2021-11-14 ENCOUNTER — Ambulatory Visit (INDEPENDENT_AMBULATORY_CARE_PROVIDER_SITE_OTHER): Payer: 59 | Admitting: Urology

## 2021-11-14 ENCOUNTER — Encounter: Payer: Self-pay | Admitting: Urology

## 2021-11-14 VITALS — BP 141/74 | HR 94 | Ht 62.0 in | Wt 149.0 lb

## 2021-11-14 DIAGNOSIS — R1032 Left lower quadrant pain: Secondary | ICD-10-CM | POA: Insufficient documentation

## 2021-11-14 DIAGNOSIS — N200 Calculus of kidney: Secondary | ICD-10-CM

## 2021-11-14 DIAGNOSIS — N201 Calculus of ureter: Secondary | ICD-10-CM

## 2021-11-14 LAB — URINALYSIS, COMPLETE (UACMP) WITH MICROSCOPIC
Glucose, UA: 100 mg/dL — AB
Ketones, ur: 15 mg/dL — AB
Leukocytes,Ua: NEGATIVE
Nitrite: NEGATIVE
Specific Gravity, Urine: >1.03 — ABNORMAL HIGH (ref 1.005–1.030)
pH: 5.5 (ref 5.0–8.0)

## 2021-11-14 MED ORDER — TAMSULOSIN HCL 0.4 MG PO CAPS: 0.4000 mg | ORAL_CAPSULE | Freq: Every day | ORAL | 0 refills | Status: DC

## 2021-11-14 MED ORDER — HYDROCODONE-ACETAMINOPHEN 5-325 MG PO TABS: 1.0000 | ORAL_TABLET | Freq: Four times a day (QID) | ORAL | 0 refills | Status: DC | PRN

## 2021-11-14 NOTE — Progress Notes (Signed)
[facility] Posting Form   Surgery Date/Time: 11/07/2021  Surgeon: Dr. Legrand Rams, MD  Surgery Location: Day Surgery  Inpt ( No  )   Outpt (Yes)   Obs ( No  )   Diagnosis: N20.1 Left Ureteral Stone  -CPT: [phone]  Surgery: Left URS/LL/Stent Placement  Stop Anticoagulations: No  Cardiac/Medical/Pulmonary Clearance needed: no  *Orders entered into EPIC  Date: 11/14/21   *Case booked in [place]  Date: 11/14/21  *Notified pt of Surgery: Date: 11/14/21  PRE-OP UA & CX: none  *Placed into Prior Authorization Work Que Date: 11/14/21   Assistant/laser/rep:No

## 2021-11-14 NOTE — Progress Notes (Signed)
° °  11/14/2021 12:38 PM   Erika Gutierrez 06/09/1956 [id number]  Reason for visit: Left-sided flank pain, nausea  HPI: 65 year old female who I saw in May 2022 when she passed a left ureteral stone.  She had been doing well since that time, however over the last week has had severe left-sided flank pain with nausea and vomiting.  She denies any fevers or chills.  Urinalysis today with 6-10 squamous cells, 6-10 WBCs, 0-5 RBCs, few bacteria.  Denies any fevers or chills.  I personally reviewed and interpreted her KUB today that again shows a 6 mm calcification over the projected course of the left mid ureter similar to prior KUB in May 2022.  With her recurrence of symptoms, I recommended a CT for better evaluation.  I personally viewed and interpreted the CT today showing a 6 mm left distal ureteral stone with moderate hydronephrosis, and multiple left upper pole renal stones measuring up to 5 mm.  We discussed various treatment options for urolithiasis including observation with or without medical expulsive therapy, shockwave lithotripsy (SWL), ureteroscopy and laser lithotripsy with stent placement, and percutaneous nephrolithotomy.  We discussed that management is based on stone size, location, density, patient co-morbidities, and patient preference.   Stones <57mm in size have a >80% spontaneous passage rate. Data surrounding the use of tamsulosin for medical expulsive therapy is controversial, but meta analyses suggests it is most efficacious for distal stones between 5-81mm in size. Possible side effects include dizziness/lightheadedness, and retrograde ejaculation.  SWL has a lower stone free rate in a single procedure, but also a lower complication rate compared to ureteroscopy and avoids a stent and associated stent related symptoms. Possible complications include renal hematoma, steinstrasse, and need for additional treatment.  Ureteroscopy with laser lithotripsy and stent placement  has a higher stone free rate than SWL in a single procedure, however increased complication rate including possible infection, ureteral injury, bleeding, and stent related morbidity. Common stent related symptoms include dysuria, urgency/frequency, and flank pain.  After an extensive discussion of the risks and benefits of the above treatment options, the patient would like to proceed with left ureteroscopy, laser lithotripsy, stent placement this week.   Sondra Come, MD  [facility] [address], [address] [place], [place] [address] [phone]

## 2021-11-14 NOTE — Addendum Note (Signed)
Addended by: Letta Kocher A on: 11/14/2021 03:08 PM   Modules accepted: Orders

## 2021-11-14 NOTE — Progress Notes (Signed)
Surgical Physician Order [facility]  * Scheduling expectation :  Friday 12/16  *Length of Case: 1 hour  *Clearance needed: no  *Anticoagulation Instructions: May continue all anticoagulants  *Aspirin Instructions: Ok to continue all  *Post-op visit Date/Instructions:   TBD  *Diagnosis: Left Ureteral Stone  *Procedure: left Ureteroscopy w/laser lithotripsy & stent placement (15726)   Additional orders: N/A  -Admit type: OUTpatient  -Anesthesia: General  -VTE Prophylaxis Standing Order SCDs       Other:   -Standing Lab Orders Per Anesthesia    Lab other: None  -Standing Test orders EKG/Chest x-ray per Anesthesia       Test other:   - Medications:  Ancef 1gm IV  -Other orders:  N/A

## 2021-11-15 ENCOUNTER — Encounter
Admission: RE | Admit: 2021-11-15 | Discharge: 2021-11-15 | Disposition: A | Discharge status: Home / Self Care | Payer: 59 | Source: Ambulatory Visit | Attending: Urology | Admitting: Urology

## 2021-11-15 ENCOUNTER — Other Ambulatory Visit: Payer: Self-pay

## 2021-11-15 HISTORY — DX: Headache, unspecified: R51.9

## 2021-11-15 HISTORY — DX: Personal history of urinary calculi: Z87.442

## 2021-11-15 LAB — URINE CULTURE: Culture: <10000 — AB

## 2021-11-15 NOTE — Patient Instructions (Signed)
Your procedure is scheduled on:11-17-21 Friday Report to the Registration Desk on the 1st floor of the [facility].Then proceed to the 2nd floor Surgery Desk in the Medical [facility] To find out your arrival time, please call [phone] between 1PM - 3PM on:11-16-21 Thursday  REMEMBER: Instructions that are not followed completely may result in serious medical risk, up to and including death; or upon the discretion of your surgeon and anesthesiologist your surgery may need to be rescheduled.  Do not eat food OR drink any liquids after midnight the night before surgery.  No gum chewing, lozengers or hard candies.  TAKE THESE MEDICATIONS THE MORNING OF SURGERY WITH A SIP OF WATER: -FLUoxetine (PROZAC) -rosuvastatin (CRESTOR) -pantoprazole (PROTONIX) -You may take HYDROcodone-acetaminophen (NORCO) for pain if needed -You may take ondansetron (ZOFRAN-ODT) for nausea/vomiting if needed  Last dose of metFORMIN (GLUCOPHAGE) NOW (11-15-21)  Do NOT take any TRESIBA Insulin the day of surgery  One week prior to surgery: Stop Anti-inflammatories (NSAIDS) such as Advil, Aleve, Ibuprofen, Motrin, Naproxen, Naprosyn and Aspirin based products such as Excedrin, Goodys Powder, BC Powder.You may however, take Tylenol if needed for pain up until the day of surgery.  Stop ANY OVER THE COUNTER supplements/vitamins NOW (11-15-21) until after surgery (Cranberry and Vitamin B12)  No Alcohol for 24 hours before or after surgery.  No Smoking including e-cigarettes for 24 hours prior to surgery.  No chewable tobacco products for at least 6 hours prior to surgery.  No nicotine patches on the day of surgery.  Do not use any "recreational" drugs for at least a week prior to your surgery.  Please be advised that the combination of cocaine and anesthesia may have negative outcomes, up to and including death. If you test positive for cocaine, your surgery will be cancelled.  On the morning of surgery brush your  teeth with toothpaste and water, you may rinse your mouth with mouthwash if you wish. Do not swallow any toothpaste or mouthwash.  Do not wear jewelry, make-up, hairpins, clips or nail polish.  Do not wear lotions, powders, or perfumes.   Do not shave body from the neck down 48 hours prior to surgery just in case you cut yourself which could leave a site for infection.  Also, freshly shaved skin may become irritated if using the CHG soap.  Contact lenses, hearing aids and dentures may not be worn into surgery.  Do not bring valuables to the hospital. [facility] is not responsible for any missing/lost belongings or valuables.   Notify your doctor if there is any change in your medical condition (cold, fever, infection).  Wear comfortable clothing (specific to your surgery type) to the hospital.  After surgery, you can help prevent lung complications by doing breathing exercises.  Take deep breaths and cough every 1-2 hours. Your doctor may order a device called an Incentive Spirometer to help you take deep breaths. When coughing or sneezing, hold a pillow firmly against your incision with both hands. This is called splinting. Doing this helps protect your incision. It also decreases belly discomfort.  If you are being admitted to the hospital overnight, leave your suitcase in the car. After surgery it may be brought to your room.  If you are being discharged the day of surgery, you will not be allowed to drive home. You will need a responsible adult (18 years or older) to drive you home and stay with you that night.   If you are taking public transportation, you will  need to have a responsible adult (18 years or older) with you. Please confirm with your physician that it is acceptable to use public transportation.   Please call the Pre-admissions Testing Dept. at [phone] if you have any questions about these instructions.  Surgery Visitation Policy:  Patients undergoing a  surgery or procedure may have one family member or support person with them as long as that person is not COVID-19 positive or experiencing its symptoms.  That person may remain in the waiting area during the procedure and may rotate out with other people.  Inpatient Visitation:    Visiting hours are 7 a.m. to 8 p.m. Up to two visitors ages 16+ are allowed at one time in a patient room. The visitors may rotate out with other people during the day. Visitors must check out when they leave, or other visitors will not be allowed. One designated support person may remain overnight. The visitor must pass COVID-19 screenings, use hand sanitizer when entering and exiting the patients room and wear a mask at all times, including in the patients room. Patients must also wear a mask when staff or their visitor are in the room. Masking is required regardless of vaccination status.

## 2021-11-16 ENCOUNTER — Encounter
Admission: RE | Admit: 2021-11-16 | Discharge: 2021-11-16 | Disposition: A | Discharge status: Home / Self Care | Payer: 59 | Source: Ambulatory Visit | Attending: Urology | Admitting: Urology

## 2021-11-16 DIAGNOSIS — Z01810 Encounter for preprocedural cardiovascular examination: Secondary | ICD-10-CM | POA: Insufficient documentation

## 2021-11-16 DIAGNOSIS — E118 Type 2 diabetes mellitus with unspecified complications: Secondary | ICD-10-CM | POA: Insufficient documentation

## 2021-11-17 ENCOUNTER — Encounter
Admission: RE | Disposition: A | Discharge status: Home / Self Care | Payer: Self-pay | Source: Home / Self Care | Attending: Urology

## 2021-11-17 ENCOUNTER — Ambulatory Visit: Payer: 59 | Admitting: Urgent Care

## 2021-11-17 ENCOUNTER — Ambulatory Visit: Payer: 59

## 2021-11-17 ENCOUNTER — Ambulatory Visit: Payer: 59 | Admitting: Anesthesiology

## 2021-11-17 ENCOUNTER — Other Ambulatory Visit: Payer: Self-pay

## 2021-11-17 ENCOUNTER — Encounter: Payer: Self-pay | Admitting: Urology

## 2021-11-17 ENCOUNTER — Ambulatory Visit
Admission: RE | Admit: 2021-11-17 | Discharge: 2021-11-17 | Disposition: A | Discharge status: Home / Self Care | Payer: 59 | Attending: Urology | Admitting: Urology

## 2021-11-17 DIAGNOSIS — N201 Calculus of ureter: Secondary | ICD-10-CM

## 2021-11-17 DIAGNOSIS — Z87891 Personal history of nicotine dependence: Secondary | ICD-10-CM | POA: Insufficient documentation

## 2021-11-17 DIAGNOSIS — N202 Calculus of kidney with calculus of ureter: Secondary | ICD-10-CM | POA: Diagnosis present

## 2021-11-17 HISTORY — PX: CYSTOSCOPY/URETEROSCOPY/HOLMIUM LASER/STENT PLACEMENT: SHX6546

## 2021-11-17 HISTORY — PX: CYSTOSCOPY W/ RETROGRADES: SHX1426

## 2021-11-17 LAB — GLUCOSE, CAPILLARY
Glucose-Capillary: 220 mg/dL — ABNORMAL HIGH (ref 70–99)
Glucose-Capillary: 185 mg/dL — ABNORMAL HIGH (ref 70–99)

## 2021-11-17 SURGERY — CYSTOSCOPY/URETEROSCOPY/HOLMIUM LASER/STENT PLACEMENT
Anesthesia: General | Laterality: Left

## 2021-11-17 MED ORDER — FENTANYL CITRATE (PF) 100 MCG/2ML IJ SOLN: 25.0000 ug | INTRAMUSCULAR | Status: DC | PRN

## 2021-11-17 MED ORDER — DEXAMETHASONE SODIUM PHOSPHATE 10 MG/ML IJ SOLN
INTRAMUSCULAR | Status: AC
  Filled 2021-11-17: qty 1

## 2021-11-17 MED ORDER — CHLORHEXIDINE GLUCONATE 0.12 % MT SOLN
OROMUCOSAL | Status: AC
  Administered 2021-11-17: 15 mL via OROMUCOSAL
  Filled 2021-11-17: qty 15

## 2021-11-17 MED ORDER — PROPOFOL 10 MG/ML IV BOLUS
INTRAVENOUS | Status: AC
  Filled 2021-11-17: qty 20

## 2021-11-17 MED ORDER — KETOROLAC TROMETHAMINE 30 MG/ML IJ SOLN
INTRAMUSCULAR | Status: DC | PRN
  Administered 2021-11-17: 15 mg via INTRAVENOUS

## 2021-11-17 MED ORDER — ONDANSETRON HCL 4 MG/2ML IJ SOLN
INTRAMUSCULAR | Status: DC | PRN
  Administered 2021-11-17: 4 mg via INTRAVENOUS

## 2021-11-17 MED ORDER — GLYCOPYRROLATE 0.2 MG/ML IJ SOLN
INTRAMUSCULAR | Status: AC
  Filled 2021-11-17: qty 1

## 2021-11-17 MED ORDER — MIDAZOLAM HCL 2 MG/2ML IJ SOLN
INTRAMUSCULAR | Status: AC
  Filled 2021-11-17: qty 2

## 2021-11-17 MED ORDER — APREPITANT 40 MG PO CAPS
40.0000 mg | ORAL_CAPSULE | Freq: Once | ORAL | Status: AC
  Administered 2021-11-17: 40 mg via ORAL

## 2021-11-17 MED ORDER — OXYCODONE HCL 5 MG PO TABS: 5.0000 mg | ORAL_TABLET | Freq: Once | ORAL | Status: DC | PRN

## 2021-11-17 MED ORDER — LIDOCAINE HCL (CARDIAC) PF 100 MG/5ML IV SOSY
PREFILLED_SYRINGE | INTRAVENOUS | Status: DC | PRN
  Administered 2021-11-17: 60 mg via INTRAVENOUS

## 2021-11-17 MED ORDER — ROCURONIUM BROMIDE 100 MG/10ML IV SOLN
INTRAVENOUS | Status: DC | PRN
  Administered 2021-11-17: 40 mg via INTRAVENOUS

## 2021-11-17 MED ORDER — CEFAZOLIN SODIUM-DEXTROSE 1-4 GM/50ML-% IV SOLN
1.0000 g | INTRAVENOUS | Status: AC
  Administered 2021-11-17: 1 g via INTRAVENOUS

## 2021-11-17 MED ORDER — CEFAZOLIN SODIUM-DEXTROSE 1-4 GM/50ML-% IV SOLN
INTRAVENOUS | Status: AC
  Filled 2021-11-17: qty 50

## 2021-11-17 MED ORDER — ONDANSETRON HCL 4 MG/2ML IJ SOLN
INTRAMUSCULAR | Status: AC
  Filled 2021-11-17: qty 2

## 2021-11-17 MED ORDER — PROPOFOL 10 MG/ML IV BOLUS
INTRAVENOUS | Status: DC | PRN
  Administered 2021-11-17: 150 mg via INTRAVENOUS

## 2021-11-17 MED ORDER — NITROFURANTOIN MACROCRYSTAL 100 MG PO CAPS: 100.0000 mg | ORAL_CAPSULE | Freq: Every day | ORAL | 0 refills | Status: AC

## 2021-11-17 MED ORDER — IOHEXOL 180 MG/ML  SOLN
INTRAMUSCULAR | Status: DC | PRN
  Administered 2021-11-17: 5 mL

## 2021-11-17 MED ORDER — ROCURONIUM BROMIDE 10 MG/ML (PF) SYRINGE
PREFILLED_SYRINGE | INTRAVENOUS | Status: AC
  Filled 2021-11-17: qty 10

## 2021-11-17 MED ORDER — LIDOCAINE HCL (PF) 2 % IJ SOLN
INTRAMUSCULAR | Status: AC
  Filled 2021-11-17: qty 5

## 2021-11-17 MED ORDER — SODIUM CHLORIDE 0.9 % IR SOLN
Status: DC | PRN
  Administered 2021-11-17: 3000 mL via INTRAVESICAL

## 2021-11-17 MED ORDER — APREPITANT 40 MG PO CAPS
ORAL_CAPSULE | ORAL | Status: AC
  Filled 2021-11-17: qty 1

## 2021-11-17 MED ORDER — FENTANYL CITRATE (PF) 100 MCG/2ML IJ SOLN
INTRAMUSCULAR | Status: AC
  Filled 2021-11-17: qty 2

## 2021-11-17 MED ORDER — ORAL CARE MOUTH RINSE: 15.0000 mL | Freq: Once | OROMUCOSAL | Status: AC

## 2021-11-17 MED ORDER — HYDROCODONE-ACETAMINOPHEN 5-325 MG PO TABS: 1.0000 | ORAL_TABLET | Freq: Four times a day (QID) | ORAL | 0 refills | Status: AC | PRN

## 2021-11-17 MED ORDER — OXYCODONE HCL 5 MG/5ML PO SOLN: 5.0000 mg | Freq: Once | ORAL | Status: DC | PRN

## 2021-11-17 MED ORDER — SODIUM CHLORIDE 0.9 % IV SOLN: INTRAVENOUS | Status: DC

## 2021-11-17 MED ORDER — CHLORHEXIDINE GLUCONATE 0.12 % MT SOLN: 15.0000 mL | Freq: Once | OROMUCOSAL | Status: AC

## 2021-11-17 MED ORDER — SUGAMMADEX SODIUM 200 MG/2ML IV SOLN
INTRAVENOUS | Status: DC | PRN
  Administered 2021-11-17: 200 mg via INTRAVENOUS

## 2021-11-17 MED ORDER — FENTANYL CITRATE (PF) 100 MCG/2ML IJ SOLN
INTRAMUSCULAR | Status: DC | PRN
  Administered 2021-11-17: 50 ug via INTRAVENOUS

## 2021-11-17 MED ORDER — GLYCOPYRROLATE 0.2 MG/ML IJ SOLN
INTRAMUSCULAR | Status: DC | PRN
  Administered 2021-11-17: .2 mg via INTRAVENOUS

## 2021-11-17 MED ORDER — DEXAMETHASONE SODIUM PHOSPHATE 10 MG/ML IJ SOLN
INTRAMUSCULAR | Status: DC | PRN
  Administered 2021-11-17: 10 mg via INTRAVENOUS

## 2021-11-17 MED ORDER — PHENYLEPHRINE 40 MCG/ML (10ML) SYRINGE FOR IV PUSH (FOR BLOOD PRESSURE SUPPORT)
PREFILLED_SYRINGE | INTRAVENOUS | Status: DC | PRN
  Administered 2021-11-17 (×2): 80 ug via INTRAVENOUS

## 2021-11-17 MED ORDER — MIDAZOLAM HCL 2 MG/2ML IJ SOLN
INTRAMUSCULAR | Status: DC | PRN
  Administered 2021-11-17: 2 mg via INTRAVENOUS

## 2021-11-17 SURGICAL SUPPLY — 31 items
BAG DRAIN CYSTO-URO LG1000N (MISCELLANEOUS) ×2 IMPLANT
BRUSH SCRUB EZ 1% IODOPHOR (MISCELLANEOUS) ×2 IMPLANT
CATH URET FLEX-TIP 2 LUMEN 10F (CATHETERS) IMPLANT
CATH URETL OPEN 5X70 (CATHETERS) ×1 IMPLANT
CNTNR SPEC 2.5X3XGRAD LEK (MISCELLANEOUS)
CONT SPEC 4OZ STER OR WHT (MISCELLANEOUS)
CONT SPEC 4OZ STRL OR WHT (MISCELLANEOUS)
CONTAINER SPEC 2.5X3XGRAD LEK (MISCELLANEOUS) IMPLANT
DRAPE UTILITY 15X26 TOWEL STRL (DRAPES) ×2 IMPLANT
DRSG TEGADERM 2-3/8X2-3/4 SM (GAUZE/BANDAGES/DRESSINGS) ×2 IMPLANT
GAUZE 4X4 16PLY ~~LOC~~+RFID DBL (SPONGE) ×4 IMPLANT
GLOVE SURG UNDER POLY LF SZ7.5 (GLOVE) ×2 IMPLANT
GOWN STRL REUS W/ TWL LRG LVL3 (GOWN DISPOSABLE) ×1 IMPLANT
GOWN STRL REUS W/ TWL XL LVL3 (GOWN DISPOSABLE) ×1 IMPLANT
GOWN STRL REUS W/TWL LRG LVL3 (GOWN DISPOSABLE) ×2
GOWN STRL REUS W/TWL XL LVL3 (GOWN DISPOSABLE) ×2
GUIDEWIRE STR DUAL SENSOR (WIRE) ×2 IMPLANT
INFUSOR MANOMETER BAG 3000ML (MISCELLANEOUS) ×2 IMPLANT
IV NS IRRIG 3000ML ARTHROMATIC (IV SOLUTION) ×2 IMPLANT
KIT TURNOVER CYSTO (KITS) ×2 IMPLANT
PACK CYSTO AR (MISCELLANEOUS) ×2 IMPLANT
SET CYSTO W/LG BORE CLAMP LF (SET/KITS/TRAYS/PACK) ×2 IMPLANT
SHEATH URETERAL 12FRX35CM (MISCELLANEOUS) IMPLANT
STENT URET 6FRX24 CONTOUR (STENTS) ×1 IMPLANT
STENT URET 6FRX26 CONTOUR (STENTS) IMPLANT
SURGILUBE 2OZ TUBE FLIPTOP (MISCELLANEOUS) ×2 IMPLANT
SYR 10ML LL (SYRINGE) ×2 IMPLANT
TRACTIP FLEXIVA PULSE ID 200 (Laser) ×1 IMPLANT
VALVE UROSEAL ADJ ENDO (VALVE) IMPLANT
WATER STERILE IRR 1000ML POUR (IV SOLUTION) ×2 IMPLANT
WATER STERILE IRR 500ML POUR (IV SOLUTION) ×2 IMPLANT

## 2021-11-17 NOTE — Op Note (Signed)
Date of procedure: 11/17/21  Preoperative diagnosis:  Left distal ureteral stone Left renal stones  Postoperative diagnosis:  Same  Procedure: Cystoscopy, left retrograde pyelogram with intraoperative interpretation, left ureteral stent placement Left ureteroscopy, laser lithotripsy of ureteral stone Left ureteroscopy, laser lithotripsy of multiple renal stones  Surgeon: Legrand Rams, MD  Anesthesia: General  Complications: None  Intraoperative findings:  Normal cystoscopy, ureteral orifices orthotopic bilaterally Uncomplicated dusting of left distal ureteral stone Uncomplicated dusting of multiple left upper pole stones Uncomplicated stent placement  EBL: Minimal  Specimens: None  Drains: Left 6 French by 24 cm ureteral stent with Dangler  Indication: Erika Gutierrez is a 65 y.o. patient with severe left-sided flank pain for 2 weeks and CT showing a 6 mm left distal ureteral stone with multiple upper pole stones.  After reviewing the management options for treatment, they elected to proceed with the above surgical procedure(s). We have discussed the potential benefits and risks of the procedure, side effects of the proposed treatment, the likelihood of the patient achieving the goals of the procedure, and any potential problems that might occur during the procedure or recuperation. Informed consent has been obtained.  Description of procedure:  The patient was taken to the operating room and general anesthesia was induced. SCDs were placed for DVT prophylaxis. The patient was placed in the dorsal lithotomy position, prepped and draped in the usual sterile fashion, and preoperative antibiotics(Ancef) were administered. A preoperative time-out was performed.   A 21 French rigid cystoscope was used to intubate the urethra and thorough cystoscopy was performed.  The bladder was grossly normal, and the ureteral orifices were orthotopic bilaterally.  With the aid of a ureteral  access catheter I was able to intubate the left ureteral orifice and a sensor wire advanced alongside the distal stone up to the kidney under fluoroscopic vision.  A semirigid ureteroscope was advanced alongside the wire and a 6 mm yellow and black stone was identified in the distal ureter.  A 240 m laser fiber and settings of 0.5 J and 80 Hz was used to dust the stone, and all pieces were irrigated free from the ureter.  The single channel digital flexible ureteroscope then advanced easily over the wire up to the kidney under fluoroscopic vision.  Thorough pyeloscopy revealed a few Randall's plaques in the midpole, and 2 larger 5 mm stones in the upper pole.  The 240 m laser fiber was used to dust all renal stones.  Thorough pyeloscopy revealed no stones > 1 mm at the conclusion of the procedure.  A retrograde pyelogram was performed from the proximal ureter and showed no extravasation or filling defect.  The sensor wire was replaced through the scope and no abnormalities were seen on pullback ureteroscopy.  A 6 French by 24 cm ureteral stent was placed fluoroscopically with an excellent curl in the renal pelvis, as well as in the bladder.  The bladder was drained, and the stent Dangler was secured to the suprapubic region with Mastisol and Tegaderm.  Disposition: Stable to PACU  Plan: Remove stent at home on Monday morning 12/19, nitrofurantoin prophylaxis Follow-up in clinic in 3 months with 24-hour urine metabolic work-up completed prior  Legrand Rams, MD

## 2021-11-17 NOTE — H&P (Signed)
° °  11/17/21 8:52 AM   Erika Gutierrez [birth year] [id number]  CC: Left distal ureteral stone  HPI: 65 year old female with 1 to 2 weeks of severe left-sided flank pain and nausea and CT showing a 6 mm left distal ureteral stone with multiple smaller upstream renal stones.  Urine culture was negative, and she opted for ureteroscopy today.  Denies any fevers or chills.   PMH: Past Medical History:  Diagnosis Date   Anemia    Anxiety    Blind left eye    Complication of anesthesia    Depression    Diabetes mellitus without complication (HCC)    GERD (gastroesophageal reflux disease)    Headache    migraines   High cholesterol    History of hiatal hernia    History of kidney stones    Hyperlipidemia    Obesity    PONV (postoperative nausea and vomiting)     Surgical History: Past Surgical History:  Procedure Laterality Date   BREAST BIOPSY Left 03/07/2015   neg- core fat necrosis    CARDIAC CATHETERIZATION     CESAREAN SECTION     x3   CHONDROPLASTY  10/07/2017   Procedure: CHONDROPLASTY;  Surgeon: Donato Heinz, MD;  Location: [facility];  Service: Orthopedics;;   COLONOSCOPY     ESOPHAGOGASTRODUODENOSCOPY (EGD) WITH PROPOFOL N/A 05/19/2021   Procedure: ESOPHAGOGASTRODUODENOSCOPY (EGD) WITH PROPOFOL;  Surgeon: Regis Bill, MD;  Location: [facility];  Service: Endoscopy;  Laterality: N/A;   KNEE ARTHROSCOPY WITH LATERAL MENISECTOMY  10/07/2017   Procedure: KNEE ARTHROSCOPY WITH PARTIAL LATERAL MENISECTOMY;  Surgeon: Donato Heinz, MD;  Location: [facility];  Service: Orthopedics;;   REDUCTION MAMMAPLASTY Bilateral 1983   thumb trigger release Right    TUBAL LIGATION      Family History: Family History  Problem Relation Age of Onset   Healthy Mother    Diabetes Father    Breast cancer Neg Hx     Social History:  reports that she quit smoking about 42 years ago. Her smoking use included cigarettes. She has a 0.75 pack-year smoking history. She has  never used smokeless tobacco. She reports that she does not drink alcohol and does not use drugs.  Physical Exam: BP 115/88    Pulse (!) 108    Temp 97.9 F (36.6 C) (Tympanic)    Resp 18    Ht 5\' 2"  (1.575 m)    Wt 67.6 kg    SpO2 98%    BMI 27.24 kg/m    Constitutional:  Alert and oriented, No acute distress. Cardiovascular: Regular rate and rhythm Respiratory: Clear to auscultation bilaterally GI: Abdomen is soft, nontender, nondistended, no abdominal masses   Laboratory Data: Culture no growth   Assessment & Plan:   65 year old female with persistent renal colic and nausea secondary to a 6 mm left distal ureteral stone, here today for ureteroscopy and laser lithotripsy.  We specifically discussed the risks ureteroscopy including bleeding, infection/sepsis, stent related symptoms including flank pain/urgency/frequency/incontinence/dysuria, ureteral injury, inability to access stone, or need for staged or additional procedures.  Left ureteroscopy, laser lithotripsy, stent placement today  78, MD 11/17/2021  [facility] [address] [place], [place] [phone]

## 2021-11-17 NOTE — Anesthesia Postprocedure Evaluation (Signed)
Anesthesia Post Note  Patient: Erika Gutierrez  Procedure(s) Performed: CYSTOSCOPY/URETEROSCOPY/HOLMIUM LASER/STENT PLACEMENT (Left) CYSTOSCOPY WITH RETROGRADE PYELOGRAM (Left)  Patient location during evaluation: PACU Anesthesia Type: General Level of consciousness: awake and awake and alert Pain management: satisfactory to patient Vital Signs Assessment: post-procedure vital signs reviewed and stable Respiratory status: spontaneous breathing and respiratory function stable Cardiovascular status: stable Anesthetic complications: no   No notable events documented.   Last Vitals:  Vitals:   11/17/21 1105 11/17/21 1110  BP: (!) 112/55 (!) 102/52  Pulse: 62 62  Resp: 16 16  Temp:    SpO2: 94% 95%    Last Pain:  Vitals:   11/17/21 1055  TempSrc:   PainSc: 0-No pain                 VAN STAVEREN,Refugio Vandevoorde

## 2021-11-17 NOTE — Anesthesia Preprocedure Evaluation (Signed)
Anesthesia Evaluation  Patient identified by MRN, date of birth, ID band Patient awake    Reviewed: Allergy & Precautions, NPO status , Patient's Chart, lab work & pertinent test results  History of Anesthesia Complications (+) PONV and history of anesthetic complications  Airway Mallampati: II  TM Distance: >3 FB Neck ROM: full    Dental  (+) Chipped, Poor Dentition   Pulmonary neg shortness of breath, former smoker,    Pulmonary exam normal        Cardiovascular Exercise Tolerance: Good (-) angina(-) Past MI and (-) DOE Normal cardiovascular exam     Neuro/Psych  Headaches, PSYCHIATRIC DISORDERS    GI/Hepatic Neg liver ROS, hiatal hernia, GERD  Medicated and Controlled,  Endo/Other  diabetes, Type 2  Renal/GU      Musculoskeletal  (+) Arthritis ,   Abdominal   Peds  Hematology negative hematology ROS (+)   Anesthesia Other Findings Past Medical History: No date: Anemia No date: Anxiety 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 No date: Complication of anesthesia No date: Depression No date: Diabetes mellitus without complication (HCC) No date: GERD (gastroesophageal reflux disease) No date: Headache     Comment:  migraines No date: High cholesterol No date: History of hiatal hernia No date: History of kidney stones No date: Hyperlipidemia No date: Obesity No date: PONV (postoperative nausea and vomiting)  Past Surgical History: 03/07/2015: BREAST BIOPSY; Left     Comment:  neg- core fat necrosis  No date: CARDIAC CATHETERIZATION No date: CESAREAN SECTION     Comment:  x3 10/07/2017: CHONDROPLASTY     Comment:  Procedure: CHONDROPLASTY;  Surgeon: Donato Heinz, MD;              Location: [facility];  Service: Orthopedics;; No date: COLONOSCOPY 05/19/2021: ESOPHAGOGASTRODUODENOSCOPY (EGD) WITH PROPOFOL; N/A     Comment:  Procedure: ESOPHAGOGASTRODUODENOSCOPY (EGD) WITH               PROPOFOL;  Surgeon: Regis Bill, MD;  Location:               [facility];  Service: Endoscopy;  Laterality: N/A; 10/07/2017: KNEE ARTHROSCOPY WITH LATERAL MENISECTOMY     Comment:  Procedure: KNEE ARTHROSCOPY WITH PARTIAL LATERAL               MENISECTOMY;  Surgeon: Donato Heinz, MD;  Location:               [facility];  Service: Orthopedics;; 1983: REDUCTION MAMMAPLASTY; Bilateral No date: thumb trigger release; Right No date: TUBAL LIGATION  BMI    Body Mass Index: 27.24 kg/m      Reproductive/Obstetrics negative OB ROS                             Anesthesia Physical Anesthesia Plan  ASA: 3  Anesthesia Plan: General ETT   Post-op Pain Management:    Induction: Intravenous  PONV Risk Score and Plan: Ondansetron, Dexamethasone, Midazolam, Treatment may vary due to age or medical condition and Aprepitant  Airway Management Planned: Oral ETT  Additional Equipment:   Intra-op Plan:   Post-operative Plan: Extubation in OR  Informed Consent: I have reviewed the patients History and Physical, chart, labs and discussed the procedure including the risks, benefits and alternatives for the proposed anesthesia with the patient or authorized representative who has indicated his/her understanding and acceptance.     Dental Advisory Given  Plan Discussed  with: Anesthesiologist, CRNA and Surgeon  Anesthesia Plan Comments: (Patient consented for risks of anesthesia including but not limited to:  - adverse reactions to medications - damage to eyes, teeth, lips or other oral mucosa - nerve damage due to positioning  - sore throat or hoarseness - Damage to heart, brain, nerves, lungs, other parts of body or loss of life  Patient voiced understanding.)        Anesthesia Quick Evaluation

## 2021-11-17 NOTE — Transfer of Care (Signed)
Immediate Anesthesia Transfer of Care Note  Patient: Erika Gutierrez  Procedure(s) Performed: CYSTOSCOPY/URETEROSCOPY/HOLMIUM LASER/STENT PLACEMENT (Left) CYSTOSCOPY WITH RETROGRADE PYELOGRAM (Left)  Patient Location: PACU  Anesthesia Type:General  Level of Consciousness: awake, alert  and oriented  Airway & Oxygen Therapy: Patient Spontanous Breathing  Post-op Assessment: Report given to RN and Post -op Vital signs reviewed and stable  Post vital signs: Reviewed and stable  Last Vitals:  Vitals Value Taken Time  BP 130/46 11/17/21 1032  Temp 36.1 C 11/17/21 1032  Pulse 77 11/17/21 1033  Resp 13 11/17/21 1033  SpO2 90 % 11/17/21 1033  Vitals shown include unvalidated device data.  Last Pain:  Vitals:   11/17/21 1032  TempSrc:   PainSc: 0-No pain         Complications: No notable events documented.

## 2021-11-17 NOTE — Anesthesia Procedure Notes (Signed)
Procedure Name: Intubation Date/Time: 11/17/2021 9:49 AM Performed by: Karoline Caldwell, CRNA Pre-anesthesia Checklist: Patient identified, Patient being monitored, Timeout performed, Emergency Drugs available and Suction available Patient Re-evaluated:Patient Re-evaluated prior to induction Oxygen Delivery Method: Circle system utilized Preoxygenation: Pre-oxygenation with 100% oxygen Induction Type: IV induction Ventilation: Mask ventilation without difficulty Laryngoscope Size: 3 and McGraph Grade View: Grade I Tube type: Oral Tube size: 7.0 mm Number of attempts: 1 Airway Equipment and Method: Stylet Placement Confirmation: ETT inserted through vocal cords under direct vision, positive ETCO2 and breath sounds checked- equal and bilateral Secured at: 21 cm Tube secured with: Tape Dental Injury: Teeth and Oropharynx as per pre-operative assessment

## 2021-11-17 NOTE — Discharge Instructions (Signed)
AMBULATORY SURGERY  ?DISCHARGE INSTRUCTIONS ? ? ?The drugs that you were given will stay in your system until tomorrow so for the next 24 hours you should not: ? ?Drive an automobile ?Make any legal decisions ?Drink any alcoholic beverage ? ? ?You may resume regular meals tomorrow.  Today it is better to start with liquids and gradually work up to solid foods. ? ?You may eat anything you prefer, but it is better to start with liquids, then soup and crackers, and gradually work up to solid foods. ? ? ?Please notify your doctor immediately if you have any unusual bleeding, trouble breathing, redness and pain at the surgery site, drainage, fever, or pain not relieved by medication. ? ? ? ?Additional Instructions: ? ? ? ?Please contact your physician with any problems or Same Day Surgery at [phone], Monday through Friday 6 am to 4 pm, or [facility] Main number at [phone].  ?

## 2021-11-18 ENCOUNTER — Encounter: Payer: Self-pay | Admitting: Urology

## 2021-11-18 ENCOUNTER — Other Ambulatory Visit: Payer: Self-pay

## 2021-11-18 ENCOUNTER — Emergency Department
Admission: EM | Admit: 2021-11-18 | Discharge: 2021-11-18 | Disposition: A | Discharge status: Home / Self Care | Payer: 59 | Attending: Emergency Medicine | Admitting: Emergency Medicine

## 2021-11-18 DIAGNOSIS — Y829 Unspecified medical devices associated with adverse incidents: Secondary | ICD-10-CM | POA: Diagnosis not present

## 2021-11-18 DIAGNOSIS — X58XXXA Exposure to other specified factors, initial encounter: Secondary | ICD-10-CM | POA: Diagnosis not present

## 2021-11-18 DIAGNOSIS — E119 Type 2 diabetes mellitus without complications: Secondary | ICD-10-CM | POA: Insufficient documentation

## 2021-11-18 DIAGNOSIS — Z87891 Personal history of nicotine dependence: Secondary | ICD-10-CM | POA: Insufficient documentation

## 2021-11-18 DIAGNOSIS — T83031A Leakage of indwelling urethral catheter, initial encounter: Secondary | ICD-10-CM | POA: Insufficient documentation

## 2021-11-18 DIAGNOSIS — Z7984 Long term (current) use of oral hypoglycemic drugs: Secondary | ICD-10-CM | POA: Diagnosis not present

## 2021-11-18 DIAGNOSIS — T83193A Other mechanical complication of other urinary stent, initial encounter: Secondary | ICD-10-CM

## 2021-11-18 LAB — URINALYSIS, COMPLETE (UACMP) WITH MICROSCOPIC
RBC / HPF: >50 RBC/hpf — ABNORMAL HIGH (ref 0–5)
Specific Gravity, Urine: 1.028 (ref 1.005–1.030)

## 2021-11-18 MED ORDER — CEPHALEXIN 500 MG PO CAPS
500.0000 mg | ORAL_CAPSULE | Freq: Once | ORAL | Status: AC
  Administered 2021-11-18: 500 mg via ORAL
  Filled 2021-11-18: qty 1

## 2021-11-18 NOTE — ED Triage Notes (Signed)
Pt reports had lithro yesterday for kidney stones and then she had a stent placed and last night she was not able to hold urine in. Pt reports it just flows out of her. Denies pain or other concerns. Pt reports constant leaking of fluids.

## 2021-11-18 NOTE — Discharge Instructions (Signed)
We have removed your stent.  Return to the ER if you are not able to urinate, fevers or any other concerns

## 2021-11-18 NOTE — ED Provider Notes (Signed)
[facility] Emergency Department Provider Note  ____________________________________________   Event Date/Time   First MD Initiated Contact with Patient 11/18/21 1149     (approximate)  I have reviewed the triage vital signs and the nursing notes.   HISTORY  Chief Complaint Post-op Problem and Urinary Incontinence    HPI Erika Gutierrez is a 64 y.o. female with diabetes, history of kidney stones who comes in with concern for postop issue.  Patient had lithotripsy done yesterday for kidney stone and she had a stent placed.  Patient reports she is constant leaking of the fluids.  Patient reports using 4 maxipads overnight and then being completely soaked is from constant dripping of the urine, nothing makes it better or worse.  She reports her pain is well controlled and denies any fevers or other concerns.  She reports a little bit of dysuria but she was told that was expected after having a stent put in          Past Medical History:  Diagnosis Date   Anemia    Anxiety    Blind left eye    Complication of anesthesia    Depression    Diabetes mellitus without complication (HCC)    GERD (gastroesophageal reflux disease)    Headache    migraines   High cholesterol    History of hiatal hernia    History of kidney stones    Hyperlipidemia    Obesity    PONV (postoperative nausea and vomiting)     Patient Active Problem List   Diagnosis Date Noted   B12 deficiency 07/18/2018   Hyperlipidemia associated with type 2 diabetes mellitus (HCC) 07/18/2018   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   Depression 10/07/2017   Diabetes mellitus type 2, uncomplicated (HCC) 10/07/2017   Hiatal hernia 10/07/2017   Hyperlipidemia, unspecified 10/07/2017   Iron deficiency anemia 10/07/2017   Obesity, unspecified 10/07/2017   Pernicious anemia 10/07/2017   Primary osteoarthritis of left knee 07/03/2017   Gastroesophageal reflux disease without esophagitis  02/06/2017    Past Surgical History:  Procedure Laterality Date   BREAST BIOPSY Left 03/07/2015   neg- core fat necrosis    CARDIAC CATHETERIZATION     CESAREAN SECTION     x3   CHONDROPLASTY  10/07/2017   Procedure: CHONDROPLASTY;  Surgeon: Donato Heinz, MD;  Location: [facility] ORS;  Service: Orthopedics;;   COLONOSCOPY     CYSTOSCOPY W/ RETROGRADES Left 11/17/2021   Procedure: CYSTOSCOPY WITH RETROGRADE PYELOGRAM;  Surgeon: Sondra Come, MD;  Location: [facility] ORS;  Service: Urology;  Laterality: Left;   CYSTOSCOPY/URETEROSCOPY/HOLMIUM LASER/STENT PLACEMENT Left 11/17/2021   Procedure: CYSTOSCOPY/URETEROSCOPY/HOLMIUM LASER/STENT PLACEMENT;  Surgeon: Sondra Come, MD;  Location: [facility] OR[facility];  Service: Urology;  Laterality: Left;   ESOPHAGOGASTRODUODENOSCOPY (EGD) WITH PROPOFOL N/A 05/19/2021   Procedure: ESOPHAGOGASTRODUODENOSCOPY (EGD) WITH PROPOFOL;  Surgeon: Regis Bill, MD;  Location: [facility] END[facility]COPY;  Service: Endoscopy;  Laterality: N/A;   KNEE ARTHROSCOPY WITH LATERAL MENISECTOMY  10/07/2017   Procedure: KNEE ARTHROSCOPY WITH PARTIAL LATERAL MENISECTOMY;  Surgeon: Donato Heinz, MD;  Location: [facility] OR[facility];  Service: Orthopedics;;   REDUCTION MAMMAPLASTY Bilateral 1983   thumb trigger release Right    TUBAL LIGATION      Prior to Admission medications   Medication Sig Start Date End Date Taking? Authorizing Provider  Continuous Blood Gluc Receiver (FREESTYLE LIBRE 14 DAY READER) DEVI Use 1 kit as directed E11.65 02/17/18   [provider]  Continuous Blood Gluc  Sensor (FREESTYLE LIBRE 14 DAY SENSOR) MISC Apply topically. 04/08/21   [provider]  CRANBERRY PO Take 1 capsule by mouth daily.    [provider]  FLUoxetine (PROZAC) 20 MG capsule Take 20 mg by mouth every morning. 03/07/21   [provider]  HYDROcodone-acetaminophen (NORCO) 5-325 MG tablet Take 1 tablet by mouth every 6 (six) hours as needed for moderate pain. 11/14/21    Billey Co, MD  HYDROcodone-acetaminophen (NORCO/VICODIN) 5-325 MG tablet Take 1 tablet by mouth every 6 (six) hours as needed for up to 3 days for moderate pain. 11/17/21 11/20/21  Billey Co, MD  metFORMIN (GLUCOPHAGE) 1000 MG tablet Take 1,000 mg by mouth 2 (two) times daily with a meal.     [provider]  nitrofurantoin (MACRODANTIN) 100 MG capsule Take 1 capsule (100 mg total) by mouth daily for 4 days. 11/17/21 11/21/21  Billey Co, MD  ondansetron (ZOFRAN-ODT) 8 MG disintegrating tablet Take 8 mg by mouth every 8 (eight) hours as needed for nausea or vomiting.    [provider]  OZEMPIC, 1 MG/DOSE, 4 MG/3ML SOPN Inject 1 mg into the skin every Sunday. 02/06/21   [provider]  pantoprazole (PROTONIX) 40 MG tablet Take 40 mg by mouth 2 (two) times daily. 03/14/21   [provider]  rosuvastatin (CRESTOR) 5 MG tablet Take 5 mg by mouth every morning.    [provider]  tamsulosin (FLOMAX) 0.4 MG CAPS capsule Take 1 capsule (0.4 mg total) by mouth daily. Patient taking differently: Take 0.4 mg by mouth daily after lunch. 11/14/21   Billey Co, MD  TRESIBA FLEXTOUCH 100 UNIT/ML FlexTouch Pen Inject 16 Units into the skin every morning. 03/27/21   [provider]  vitamin B-12 (CYANOCOBALAMIN) 1000 MCG tablet Take 1,000 mcg by mouth daily.    [provider]    Allergies Sulfa antibiotics, Augmentin [amoxicillin-pot clavulanate], and Simvastatin  Family History  Problem Relation Age of Onset   Healthy Mother    Diabetes Father    Breast cancer Neg Hx     Social History Social History   Tobacco Use   Smoking status: Former    Packs/day: 0.25    Years: 3.00    Pack years: 0.75    Types: Cigarettes    Quit date: 1980    Years since quitting: 42.9   Smokeless tobacco: Never  Vaping Use   Vaping Use: Never used  Substance Use Topics   Alcohol use: No   Drug use: No      Review of  Systems Constitutional: No fever/chills Eyes: No visual changes. ENT: No sore throat. Cardiovascular: Denies chest pain. Respiratory: Denies shortness of breath. Gastrointestinal: No abdominal pain.  No nausea, no vomiting.  No diarrhea.  No constipation. Genitourinary: Positive dysuria, leaking urine Musculoskeletal: Negative for back pain. Skin: Negative for rash. Neurological: Negative for headaches, focal weakness or numbness. All other ROS negative ____________________________________________   PHYSICAL EXAM:  VITAL SIGNS: ED Triage Vitals  Enc Vitals Group     BP 11/18/21 1125 (!) 140/55     Pulse Rate 11/18/21 1125 (!) 51     Resp 11/18/21 1125 20     Temp 11/18/21 1125 98 F (36.7 C)     Temp Source 11/18/21 1125 Oral     SpO2 11/18/21 1125 97 %     Weight 11/18/21 1124 150 lb (68 kg)     Height 11/18/21 1124 $RemoveBefor'5\' 2"'[id number]$  (1.575 m)  Head Circumference --      Peak Flow --      Pain Score --      Pain Loc --      Pain Edu? --      Excl. in GC? --     Constitutional: Alert and oriented. Well appearing and in no acute distress. Eyes: Conjunctivae are normal. EOMI. Head: Atraumatic. Nose: No congestion/rhinnorhea. Mouth/Throat: Mucous membranes are moist.   Neck: No stridor. Trachea Midline. FROM Cardiovascular: Normal rate, regular rhythm. Grossly normal heart sounds.  Good peripheral circulation. Respiratory: Normal respiratory effort.  No retractions. Lungs CTAB. Gastrointestinal: Soft and nontender. No distention. No abdominal bruits.  Musculoskeletal: No lower extremity tenderness nor edema.  No joint effusions. Neurologic:  Normal speech and language. No gross focal neurologic deficits are appreciated.  Skin:  Skin is warm, dry and intact. No rash noted. Psychiatric: Mood and affect are normal. Speech and behavior are normal. GU: taped noted to the upper abdomen  ____________________________________________   LABS (all labs ordered are listed, but only  abnormal results are displayed)  Labs Reviewed  URINALYSIS, COMPLETE (UACMP) WITH MICROSCOPIC   ____________________________________________  PROCEDURES  Procedure(s) performed (including Critical Care):  .Foreign Body Removal  Date/Time: 11/18/2021 1:05 PM Performed by: Concha Se, MD Authorized by: Concha Se, MD  Consent: Verbal consent obtained. Consent given by: patient Patient identity confirmed: verbally with patient Intake: urethra. Post-procedure assessment: foreign body removed Patient tolerance: patient tolerated the procedure well with no immediate complications Comments: Stent placed by urology was removed through the urethra without any difficulties    ____________________________________________   INITIAL IMPRESSION / ASSESSMENT AND PLAN / ED COURSE  Erika Gutierrez was evaluated in Emergency Department on 11/18/2021 for the symptoms described in the history of present illness. She was evaluated in the context of the global COVID-19 pandemic, which necessitated consideration that the patient might be at risk for infection with the SARS-CoV-2 virus that causes COVID-19. Institutional protocols and algorithms that pertain to the evaluation of patients at risk for COVID-19 are in a state of rapid change based on information released by regulatory bodies including the CDC and federal and state organizations. These policies and algorithms were followed during the patient's care in the ED.     Patient comes in with continued urinary frequency with stent placed yesterday for lithotripsy.  Bladder scan done no evidence of residual to suggest retention.  UA done that is bloody in nature most likely secondary to the stent.  Patient reports already being on antibiotics from urology.  Discussed the case with Dr. Apolinar Junes who recommended that we remove the stent.  Most likely the stent was stenting open the urethra leading to urinary incontinence.  Stent was removed  without any issues.  Patient was given 1 dose of oral Keflex per Dr. Assunta Gambles recommendation prior to removal.  Patient felt better and felt comfortable with discharge home.  Discussed with patient return precautions including urinary retention or any other concerns      ____________________________________________   FINAL CLINICAL IMPRESSION(S) / ED DIAGNOSES   Final diagnoses:  Mechanical complication of urethral stent (HCC)      MEDICATIONS GIVEN DURING THIS VISIT:  Medications  cephALEXin (KEFLEX) capsule 500 mg (500 mg Oral Given 11/18/21 1258)     ED Discharge Orders     None        Note:  This document was prepared using Dragon voice recognition software and may include unintentional dictation errors.  Vanessa Tyrone, MD 11/18/21 [phone]

## 2022-01-03 ENCOUNTER — Other Ambulatory Visit: Payer: Self-pay | Admitting: Physician Assistant

## 2022-01-03 DIAGNOSIS — Z1231 Encounter for screening mammogram for malignant neoplasm of breast: Secondary | ICD-10-CM

## 2022-01-03 DIAGNOSIS — E1165 Type 2 diabetes mellitus with hyperglycemia: Secondary | ICD-10-CM | POA: Diagnosis not present

## 2022-01-03 DIAGNOSIS — Z0000 Encounter for general adult medical examination without abnormal findings: Secondary | ICD-10-CM | POA: Diagnosis not present

## 2022-01-03 DIAGNOSIS — R69 Illness, unspecified: Secondary | ICD-10-CM | POA: Diagnosis not present

## 2022-01-03 DIAGNOSIS — E1169 Type 2 diabetes mellitus with other specified complication: Secondary | ICD-10-CM | POA: Diagnosis not present

## 2022-02-11 ENCOUNTER — Ambulatory Visit (INDEPENDENT_AMBULATORY_CARE_PROVIDER_SITE_OTHER): Payer: 59

## 2022-02-11 ENCOUNTER — Ambulatory Visit
Admission: EM | Admit: 2022-02-11 | Discharge: 2022-02-11 | Disposition: A | Discharge status: Home / Self Care | Payer: 59 | Attending: Emergency Medicine | Admitting: Emergency Medicine

## 2022-02-11 ENCOUNTER — Other Ambulatory Visit: Payer: Self-pay

## 2022-02-11 DIAGNOSIS — R110 Nausea: Secondary | ICD-10-CM | POA: Diagnosis not present

## 2022-02-11 DIAGNOSIS — N23 Unspecified renal colic: Secondary | ICD-10-CM | POA: Insufficient documentation

## 2022-02-11 DIAGNOSIS — R109 Unspecified abdominal pain: Secondary | ICD-10-CM

## 2022-02-11 LAB — URINALYSIS, COMPLETE (UACMP) WITH MICROSCOPIC
Glucose, UA: 500 mg/dL — AB
Ketones, ur: 15 mg/dL — AB
Leukocytes,Ua: NEGATIVE
Nitrite: NEGATIVE
Protein, ur: 30 mg/dL — AB
Specific Gravity, Urine: 1.025 (ref 1.005–1.030)
pH: 6 (ref 5.0–8.0)

## 2022-02-11 LAB — CBC WITH DIFFERENTIAL/PLATELET
Abs Immature Granulocytes: 0.02 10*3/uL (ref 0.00–0.07)
Basophils Absolute: 0.1 10*3/uL (ref 0.0–0.1)
Basophils Relative: 1 %
Eosinophils Absolute: 0 10*3/uL (ref 0.0–0.5)
Eosinophils Relative: 0 %
HCT: 35.6 % — ABNORMAL LOW (ref 36.0–46.0)
Hemoglobin: 12 g/dL (ref 12.0–15.0)
Immature Granulocytes: 0 %
Lymphocytes Relative: 12 %
Lymphs Abs: 1.2 10*3/uL (ref 0.7–4.0)
MCH: 30.8 pg (ref 26.0–34.0)
MCHC: 33.7 g/dL (ref 30.0–36.0)
MCV: 91.3 fL (ref 80.0–100.0)
Monocytes Absolute: 1 10*3/uL (ref 0.1–1.0)
Monocytes Relative: 10 %
Neutro Abs: 7.2 10*3/uL (ref 1.7–7.7)
Neutrophils Relative %: 77 %
Platelets: 216 10*3/uL (ref 150–400)
RBC: 3.9 MIL/uL (ref 3.87–5.11)
RDW: 13.2 % (ref 11.5–15.5)
WBC: 9.4 10*3/uL (ref 4.0–10.5)
nRBC: 0 % (ref 0.0–0.2)

## 2022-02-11 LAB — COMPREHENSIVE METABOLIC PANEL
ALT: 14 U/L (ref 0–44)
AST: 22 U/L (ref 15–41)
Albumin: 3.9 g/dL (ref 3.5–5.0)
Alkaline Phosphatase: 52 U/L (ref 38–126)
Anion gap: 11 (ref 5–15)
BUN: 19 mg/dL (ref 8–23)
CO2: 25 mmol/L (ref 22–32)
Calcium: 8.8 mg/dL — ABNORMAL LOW (ref 8.9–10.3)
Chloride: 97 mmol/L — ABNORMAL LOW (ref 98–111)
Creatinine, Ser: 1.37 mg/dL — ABNORMAL HIGH (ref 0.44–1.00)
GFR, Estimated: 43 mL/min — ABNORMAL LOW (ref >60)
Glucose, Bld: 276 mg/dL — ABNORMAL HIGH (ref 70–99)
Potassium: 3.8 mmol/L (ref 3.5–5.1)
Sodium: 133 mmol/L — ABNORMAL LOW (ref 135–145)
Total Bilirubin: 0.9 mg/dL (ref 0.3–1.2)
Total Protein: 7.1 g/dL (ref 6.5–8.1)

## 2022-02-11 MED ORDER — TAMSULOSIN HCL 0.4 MG PO CAPS: 0.4000 mg | ORAL_CAPSULE | Freq: Every day | ORAL | 0 refills | Status: AC

## 2022-02-11 MED ORDER — OXYCODONE-ACETAMINOPHEN 5-325 MG PO TABS
1.0000 | ORAL_TABLET | Freq: Four times a day (QID) | ORAL | 0 refills | Status: DC | PRN
Start: 2022-02-11 — End: 2023-04-16

## 2022-02-11 MED ORDER — KETOROLAC TROMETHAMINE 10 MG PO TABS: 10.0000 mg | ORAL_TABLET | Freq: Four times a day (QID) | ORAL | 0 refills | Status: DC | PRN

## 2022-02-11 NOTE — ED Provider Notes (Signed)
HPI  SUBJECTIVE:  Erika Gutierrez is a 66 y.o. female who presents with 3 days of nonradiating, nonmigratory right flank/back pain described as sore, constant accompanied with nausea, 5-6 episodes of nonbilious, nonbloody emesis over the past 3 days.  She reports urinary urgency, frequency.   No abdominal distention.  She had a normal bowel movement 3 days ago.  No dysuria, cloudy odorous urine, hematuria, suprapubic pain, coughing, wheezing, shortness of breath.  States a car ride over here was not painful.  No antibiotics in the past month.  No antipyretic in the past 6 hours.  She tried hydrocodone without improvement in her symptoms.  She states the pain is no longer controlled with hydrocodone.  No alleviating factors.  Symptoms are worse with breathing, movement.  She has a past medical history of UTI and obstructive nephrolithiasis, diabetes, hypercholesterolemia.  She had a left-sided stent placed on 11/17/2021 which was removed on 12/17.  No history of hypertension, chronic kidney disease, gallbladder, liver disease, pancreatitis.  [facility]P: [facility].  Urology: [facility] urology.  She has scheduled already followed up in 3 days on Wednesday.  Past Medical History:  Diagnosis Date   Anemia    Anxiety    Blind left eye    Complication of anesthesia    Depression    Diabetes mellitus without complication (HCC)    GERD (gastroesophageal reflux disease)    Headache    migraines   High cholesterol    History of hiatal hernia    History of kidney stones    Hyperlipidemia    Obesity    PONV (postoperative nausea and vomiting)     Past Surgical History:  Procedure Laterality Date   BREAST BIOPSY Left 03/07/2015   neg- core fat necrosis    CARDIAC CATHETERIZATION     CESAREAN SECTION     x3   CHONDROPLASTY  10/07/2017   Procedure: CHONDROPLASTY;  Surgeon: Dereck Leep, MD;  Location: [facility] OR[facility];  Service: Orthopedics;;   COLONOSCOPY     CYSTOSCOPY W/ RETROGRADES Left  11/17/2021   Procedure: CYSTOSCOPY WITH RETROGRADE PYELOGRAM;  Surgeon: Billey Co, MD;  Location: [facility];  Service: Urology;  Laterality: Left;   CYSTOSCOPY/URETEROSCOPY/HOLMIUM LASER/STENT PLACEMENT Left 11/17/2021   Procedure: CYSTOSCOPY/URETEROSCOPY/HOLMIUM LASER/STENT PLACEMENT;  Surgeon: Billey Co, MD;  Location: [facility];  Service: Urology;  Laterality: Left;   ESOPHAGOGASTRODUODENOSCOPY (EGD) WITH PROPOFOL N/A 05/19/2021   Procedure: ESOPHAGOGASTRODUODENOSCOPY (EGD) WITH PROPOFOL;  Surgeon: Lesly Rubenstein, MD;  Location: [facility];  Service: Endoscopy;  Laterality: N/A;   KNEE ARTHROSCOPY WITH LATERAL MENISECTOMY  10/07/2017   Procedure: KNEE ARTHROSCOPY WITH PARTIAL LATERAL MENISECTOMY;  Surgeon: Dereck Leep, MD;  Location: [facility];  Service: Orthopedics;;   REDUCTION MAMMAPLASTY Bilateral 1983   thumb trigger release Right    TUBAL LIGATION      Family History  Problem Relation Age of Onset   Healthy Mother    Diabetes Father    Breast cancer Neg Hx     Social History   Tobacco Use   Smoking status: Former    Packs/day: 0.25    Years: 3.00    Pack years: 0.75    Types: Cigarettes    Quit date: 1980    Years since quitting: 43.2   Smokeless tobacco: Never  Vaping Use   Vaping Use: Never used  Substance Use Topics   Alcohol use: No   Drug use: No    No current facility-administered medications for this encounter.  Current Outpatient Medications:    oxyCODONE-acetaminophen (PERCOCET/ROXICET) 5-325 MG tablet, Take 1-2 tablets by mouth every 6 (six) hours as needed for severe pain., Disp: 12 tablet, Rfl: 0   tamsulosin (FLOMAX) 0.4 MG CAPS capsule, Take 1 capsule (0.4 mg total) by mouth at bedtime for 7 days., Disp: 7 capsule, Rfl: 0   Continuous Blood Gluc Receiver (FREESTYLE LIBRE 14 DAY READER) DEVI, Use 1 kit as directed E11.65, Disp: , Rfl:    Continuous Blood Gluc Sensor (FREESTYLE LIBRE 14 DAY SENSOR) MISC, Apply topically., Disp:  , Rfl:    CRANBERRY PO, Take 1 capsule by mouth daily., Disp: , Rfl:    FLUoxetine (PROZAC) 20 MG capsule, Take 20 mg by mouth every morning., Disp: , Rfl:    metFORMIN (GLUCOPHAGE) 1000 MG tablet, Take 1,000 mg by mouth 2 (two) times daily with a meal. , Disp: , Rfl:    ondansetron (ZOFRAN-ODT) 8 MG disintegrating tablet, Take 8 mg by mouth every 8 (eight) hours as needed for nausea or vomiting., Disp: , Rfl:    OZEMPIC, 1 MG/DOSE, 4 MG/3ML SOPN, Inject 1 mg into the skin every Sunday., Disp: , Rfl:    pantoprazole (PROTONIX) 40 MG tablet, Take 40 mg by mouth 2 (two) times daily., Disp: , Rfl:    rosuvastatin (CRESTOR) 5 MG tablet, Take 5 mg by mouth every morning., Disp: , Rfl:    TRESIBA FLEXTOUCH 100 UNIT/ML FlexTouch Pen, Inject 16 Units into the skin every morning., Disp: , Rfl:    vitamin B-12 (CYANOCOBALAMIN) 1000 MCG tablet, Take 1,000 mcg by mouth daily., Disp: , Rfl:   Allergies  Allergen Reactions   Sulfa Antibiotics Other (See Comments)    constipation   Augmentin [Amoxicillin-Pot Clavulanate] Other (See Comments)    constipation   Simvastatin     Other reaction(s): Muscle Pain     ROS  As noted in HPI.   Physical Exam  BP 137/65 (BP Location: Right Arm)    Pulse 66    Resp 16    SpO2 96%   Constitutional: Well developed, well nourished, no acute distress Eyes:  EOMI, conjunctiva normal bilaterally HENT: Normocephalic, atraumatic,mucus membranes moist Respiratory: Normal inspiratory effort Cardiovascular: Normal rate GI: nondistended.  Positive right flank tenderness.  Negative Murphy, negative McBurney, soft, hypoactive bowel sounds, no guarding, rebound. Back: Right CVAT skin: No rash, skin intact Musculoskeletal: no deformities Neurologic: Alert & oriented x 3, no focal neuro deficits Psychiatric: Speech and behavior appropriate   ED Course   Medications - No data to display  Orders Placed This Encounter  Procedures   Urine Culture    Standing  Status:   Standing    Number of Occurrences:   1    Order Specific Question:   Indication    Answer:   Flank Pain   DG Abd 1 View    Standing Status:   Standing    Number of Occurrences:   1    Order Specific Question:   Reason for Exam (SYMPTOM  OR DIAGNOSIS REQUIRED)    Answer:   Right flank pain, CVAT, rule out nephrolithiasis   Urinalysis, Complete w Microscopic Urine, Clean Catch    Standing Status:   Standing    Number of Occurrences:   1    Order Specific Question:   Release to patient    Answer:   Immediate   CBC with Differential    Standing Status:   Standing    Number of Occurrences:   1  Comprehensive metabolic panel    Standing Status:   Standing    Number of Occurrences:   1    Results for orders placed or performed during the hospital encounter of 02/11/22 (from the past 24 hour(s))  Urinalysis, Complete w Microscopic Urine, Clean Catch     Status: Abnormal   Collection Time: 02/11/22  9:42 AM  Result Value Ref Range   Color, Urine YELLOW YELLOW   APPearance CLEAR CLEAR   Specific Gravity, Urine 1.025 1.005 - 1.030   pH 6.0 5.0 - 8.0   Glucose, UA 500 (A) NEGATIVE mg/dL   Hgb urine dipstick SMALL (A) NEGATIVE   Bilirubin Urine SMALL (A) NEGATIVE   Ketones, ur 15 (A) NEGATIVE mg/dL   Protein, ur 30 (A) NEGATIVE mg/dL   Nitrite NEGATIVE NEGATIVE   Leukocytes,Ua NEGATIVE NEGATIVE   Squamous Epithelial / LPF 0-5 0 - 5   WBC, UA 0-5 0 - 5 WBC/hpf   RBC / HPF 6-10 0 - 5 RBC/hpf   Bacteria, UA RARE (A) NONE SEEN  CBC with Differential     Status: Abnormal   Collection Time: 02/11/22 10:13 AM  Result Value Ref Range   WBC 9.4 4.0 - 10.5 K/uL   RBC 3.90 3.87 - 5.11 MIL/uL   Hemoglobin 12.0 12.0 - 15.0 g/dL   HCT 35.6 (L) 36.0 - 46.0 %   MCV 91.3 80.0 - 100.0 fL   MCH 30.8 26.0 - 34.0 pg   MCHC 33.7 30.0 - 36.0 g/dL   RDW 13.2 11.5 - 15.5 %   Platelets 216 150 - 400 K/uL   nRBC 0.0 0.0 - 0.2 %   Neutrophils Relative % 77 %   Neutro Abs 7.2 1.7 - 7.7 K/uL    Lymphocytes Relative 12 %   Lymphs Abs 1.2 0.7 - 4.0 K/uL   Monocytes Relative 10 %   Monocytes Absolute 1.0 0.1 - 1.0 K/uL   Eosinophils Relative 0 %   Eosinophils Absolute 0.0 0.0 - 0.5 K/uL   Basophils Relative 1 %   Basophils Absolute 0.1 0.0 - 0.1 K/uL   Immature Granulocytes 0 %   Abs Immature Granulocytes 0.02 0.00 - 0.07 K/uL  Comprehensive metabolic panel     Status: Abnormal   Collection Time: 02/11/22 10:13 AM  Result Value Ref Range   Sodium 133 (L) 135 - 145 mmol/L   Potassium 3.8 3.5 - 5.1 mmol/L   Chloride 97 (L) 98 - 111 mmol/L   CO2 25 22 - 32 mmol/L   Glucose, Bld 276 (H) 70 - 99 mg/dL   BUN 19 8 - 23 mg/dL   Creatinine, Ser 1.37 (H) 0.44 - 1.00 mg/dL   Calcium 8.8 (L) 8.9 - 10.3 mg/dL   Total Protein 7.1 6.5 - 8.1 g/dL   Albumin 3.9 3.5 - 5.0 g/dL   AST 22 15 - 41 U/L   ALT 14 0 - 44 U/L   Alkaline Phosphatase 52 38 - 126 U/L   Total Bilirubin 0.9 0.3 - 1.2 mg/dL   GFR, Estimated 43 (L) >60 mL/min   Anion gap 11 5 - 15   DG Abd 1 View  Result Date: 02/11/2022 CLINICAL DATA:  RIGHT flank pain and nausea. EXAM: ABDOMEN - 1 VIEW COMPARISON:  11/14/2021 CT and radiograph. FINDINGS: Multiple calcifications within the abdomen and pelvis are unchanged from the prior study. No new calcifications are identified overlying the courses of the ureters to suggest radiopaque urinary calculus. The previously identified RIGHT UVJ  calculus is no longer identified. The calcification to the LEFT of the L4 vertebral body is unchanged and on prior CT was outside of the ureter. Gaseous distension of the transverse colon is noted. A mildly distended gas-filled loop of small bowel within the LEFT abdomen is noted. Marland Kitchen IMPRESSION: 1. No new calcifications identified overlying the courses of the ureters to suggest new radiopaque urinary calculus on this study. Consider CT for further evaluation as indicated. 2. Nonspecific bowel gas pattern. Gaseous colonic distension and single mildly  distended gas-filled small bowel loop within the LEFT abdomen may represent an ileus. Electronically Signed   By: Margarette Canada M.D.   On: 02/11/2022 10:24    ED Clinical Impression  1. Renal colic on right side      ED Assessment/Plan  Concern for right-sided nephrolithiasis.  Doubt hepatitis, cholelithiasis, pancreatitis, appendicitis.  Discussed with patient concern for obstructing nephrolithiasis since hydrocodone is no longer working for her, however, CT is not available here today.  Discussed with patient possibility of going to the ED for definitive imaging and work-up now versus checking checking a CMP, CBC, UA, KUB with the understanding that if any of these are abnormal, she will be sent to the ER.   She has follow-up scheduled on Wednesday, but states that she can reschedule it to tomorrow.  She states that she just paid off the last CT, and would like to try the latter option.  She appears nontoxic, vitals are normal, feel it is reasonable to try a limited work-up here.  She declined pain, nausea medication  [place] Narcotic database reviewed for this patient, and feel that the risk/benefit ratio today is favorable for proceeding with a prescription for controlled substance.  Reviewed imaging independently. no new calcifications identified suggestive of new radiopaque nephrolithiasis See radiology report for full details.  CMP reviewed.  Elevated creatinine 1.37.  Last creatinine 4 years ago was normal.  Her CBC is normal.  Imaging negative for stone, UA with small hematuria and rare bacteria.  However, no nitrites, esterase will send off for culture prior to initiating antibiotic therapy.  Discussed case with Dr. Diamantina Providence, urology on-call, will send home on Flomax, Percocet, short course of Toradol.  Concern for elevated creatinine, however, this has been cleared by urology.  Patient states she has Zofran already at home  Discussed labs, imaging, MDM, treatment plan, and plan for follow-up  with patient. Discussed sn/sx that should prompt return to the ED. patient agrees with plan.   Meds ordered this encounter  Medications   oxyCODONE-acetaminophen (PERCOCET/ROXICET) 5-325 MG tablet    Sig: Take 1-2 tablets by mouth every 6 (six) hours as needed for severe pain.    Dispense:  12 tablet    Refill:  0   tamsulosin (FLOMAX) 0.4 MG CAPS capsule    Sig: Take 1 capsule (0.4 mg total) by mouth at bedtime for 7 days.    Dispense:  7 capsule    Refill:  0      *This clinic note was created using [employer]. Therefore, there may be occasional mistakes despite careful proofreading.  ?    Melynda Ripple, MD 02/11/22 1101

## 2022-02-11 NOTE — Discharge Instructions (Addendum)
Continue pushing plenty of extra fluids.  There is no evidence of infection in your urine culture, or obvious stones on your imaging.  I am sending you home on Toradol and Flomax.  Percocet for severe pain.  I have also contacted your urologist, so they are aware that you are here today.  Go Immediately to the ER for fevers above 100.4, pain not controlled with the Percocet and Toradol, or for any other concern ?

## 2022-02-11 NOTE — ED Triage Notes (Signed)
Pt present having issue with right side flank pain with nausea. Symptom started on Friday.  ?

## 2022-02-12 LAB — URINE CULTURE: Culture: 10000 — AB

## 2022-02-14 ENCOUNTER — Encounter: Payer: Self-pay | Admitting: Urology

## 2022-02-14 ENCOUNTER — Ambulatory Visit (INDEPENDENT_AMBULATORY_CARE_PROVIDER_SITE_OTHER): Payer: 59 | Admitting: Urology

## 2022-02-14 ENCOUNTER — Other Ambulatory Visit: Payer: Self-pay

## 2022-02-14 VITALS — BP 134/74 | HR 97 | Ht 62.0 in | Wt 150.0 lb

## 2022-02-14 DIAGNOSIS — N200 Calculus of kidney: Secondary | ICD-10-CM | POA: Diagnosis not present

## 2022-02-14 NOTE — Patient Instructions (Signed)
Dietary Guidelines to Help Prevent Kidney Stones Kidney stones are deposits of minerals and salts that form inside your kidneys. Your risk of developing kidney stones may be greater depending on your diet, your lifestyle, the medicines you take, and whether you have certain medical conditions. Most people can lower their chances of developing kidney stones by following the instructions below. Your dietitian may give you more specific instructions depending on your overall health and the type of kidney stones you tend to develop. What are tips for following this plan? Reading food labels  Choose foods with "no salt added" or "low-salt" labels. Limit your salt (sodium) intake to less than 1,500 mg a day. Choose foods with calcium for each meal and snack. Try to eat about 300 mg of calcium at each meal. Foods that contain 200-500 mg of calcium a serving include: 8 oz (237 mL) of milk, calcium-fortifiednon-dairy milk, and calcium-fortifiedfruit juice. Calcium-fortified means that calcium has been added to these drinks. 8 oz (237 mL) of kefir, yogurt, and soy yogurt. 4 oz (114 g) of tofu. 1 oz (28 g) of cheese. 1 cup (150 g) of dried figs. 1 cup (91 g) of cooked broccoli. One 3 oz (85 g) can of sardines or mackerel. Most people need 1,000-1,500 mg of calcium a day. Talk to your dietitian about how much calcium is recommended for you. Shopping Buy plenty of fresh fruits and vegetables. Most people do not need to avoid fruits and vegetables, even if these foods contain nutrients that may contribute to kidney stones. When shopping for convenience foods, choose: Whole pieces of fruit. Pre-made salads with dressing on the side. Low-fat fruit and yogurt smoothies. Avoid buying frozen meals or prepared deli foods. These can be high in sodium. Look for foods with live cultures, such as yogurt and kefir. Choose high-fiber grains, such as whole-wheat breads, oat bran, and wheat cereals. Cooking Do not add  salt to food when cooking. Place a salt shaker on the table and allow each person to add his or her own salt to taste. Use vegetable protein, such as beans, textured vegetable protein (TVP), or tofu, instead of meat in pasta, casseroles, and soups. Meal planning Eat less salt, if told by your dietitian. To do this: Avoid eating processed or pre-made food. Avoid eating fast food. Eat less animal protein, including cheese, meat, poultry, or fish, if told by your dietitian. To do this: Limit the number of times you have meat, poultry, fish, or cheese each week. Eat a diet free of meat at least 2 days a week. Eat only one serving each day of meat, poultry, fish, or seafood. When you prepare animal protein, cut pieces into small portion sizes. For most meat and fish, one serving is about the size of the palm of your hand. Eat at least five servings of fresh fruits and vegetables each day. To do this: Keep fruits and vegetables on hand for snacks. Eat one piece of fruit or a handful of berries with breakfast. Have a salad and fruit at lunch. Have two kinds of vegetables at dinner. Limit foods that are high in a substance called oxalate. These include: Spinach (cooked), rhubarb, beets, sweet potatoes, and Swiss chard. Peanuts. Potato chips, french fries, and baked potatoes with skin on. Nuts and nut products. Chocolate. If you regularly take a diuretic medicine, make sure to eat at least 1 or 2 servings of fruits or vegetables that are high in potassium each day. These include: Avocado. Banana. Orange, prune,   carrot, or tomato juice. Baked potato. Cabbage. Beans and split peas. Lifestyle  Drink enough fluid to keep your urine pale yellow. This is the most important thing you can do. Spread your fluid intake throughout the day. If you drink alcohol: Limit how much you use to: 0-1 drink a day for women who are not pregnant. 0-2 drinks a day for men. Be aware of how much alcohol is in your  drink. In the [place] one drink equals one 12 oz bottle of beer (355 mL), one 5 oz glass of wine (148 mL), or one 1 oz glass of hard liquor (44 mL). Lose weight if told by your health care provider. Work with your dietitian to find an eating plan and weight loss strategies that work best for you. General information Talk to your health care provider and dietitian about taking daily supplements. You may be told the following depending on your health and the cause of your kidney stones: Not to take supplements with vitamin C. To take a calcium supplement. To take a daily probiotic supplement. To take other supplements such as magnesium, fish oil, or vitamin B6. Take over-the-counter and prescription medicines only as told by your health care provider. These include supplements. What foods should I limit? Limit your intake of the following foods, or eat them as told by your dietitian. Vegetables Spinach. Rhubarb. Beets. Canned vegetables. Pickles. Olives. Baked potatoes with skin. Grains Wheat bran. Baked goods. Salted crackers. Cereals high in sugar. Meats and other proteins Nuts. Nut butters. Large portions of meat, poultry, or fish. Salted, precooked, or cured meats, such as sausages, meat loaves, and hot dogs. Dairy Cheese. Beverages Regular soft drinks. Regular vegetable juice. Seasonings and condiments Seasoning blends with salt. Salad dressings. Soy sauce. Ketchup. Barbecue sauce. Other foods Canned soups. Canned pasta sauce. Casseroles. Pizza. Lasagna. Frozen meals. Potato chips. French fries. The items listed above may not be a complete list of foods and beverages you should limit. Contact a dietitian for more information. What foods should I avoid? Talk to your dietitian about specific foods you should avoid based on the type of kidney stones you have and your overall health. Fruits Grapefruit. The item listed above may not be a complete list of foods and beverages you should  avoid. Contact a dietitian for more information. Summary Kidney stones are deposits of minerals and salts that form inside your kidneys. You can lower your risk of kidney stones by making changes to your diet. The most important thing you can do is drink enough fluid. Drink enough fluid to keep your urine pale yellow. Talk to your dietitian about how much calcium you should have each day, and eat less salt and animal protein as told by your dietitian. This information is not intended to replace advice given to you by your health care provider. Make sure you discuss any questions you have with your health care provider. Document Revised: 11/12/2019 Document Reviewed: 11/12/2019 Elsevier Patient Education  2022 [employer]vier Inc.  

## 2022-02-14 NOTE — Progress Notes (Signed)
? ?  02/14/2022 ?11:16 AM  ? ?Erika Gutierrez ?03-12-1956 ?[id number] ? ?Reason for visit: Nephrolithiasis ? ?HPI: ?66 year old female who underwent left-sided ureteroscopy for a distal ureteral stone and multiple renal stones in 01-16-2023who presents for follow-up.  She actually passed a 4 mm right ureteral stone just a few days ago, and brought it with her to clinic today.  She was seen in urgent care where urinalysis showed microscopic hematuria, but labs were otherwise benign and she was discharged with medical expulsive therapy and Flomax.  She passed her stone less than 2 days later with resolution of her right-sided renal colic. ? ?Based on her CT findings from 01/16/23now that she has passed a 4 mm right-sided stone she likely does not have any significant stone burden at this time. ? ?She brought her 24-hour urine test today, and we will send this for analysis.  Stone was also sent for analysis. ? ?We discussed general stone prevention strategies including adequate hydration with goal of producing 2.5 L of urine daily, increasing citric acid intake, increasing calcium intake during high oxalate meals, minimizing animal protein, and decreasing salt intake. Information about dietary recommendations given today.  ? ?Call with 24-hour urine results/stone analysis and recommendations ?RTC 1 year KUB prior ? ? ?Sondra Come, MD ? ?[facility] ?[address] ?[place], [place] [address] ?[phone] ? ? ?

## 2022-02-18 LAB — CALCULI, WITH PHOTOGRAPH (CLINICAL LAB)
Calcium Oxalate Monohydrate: 100 %
Weight Calculi: 48 mg

## 2022-02-20 ENCOUNTER — Other Ambulatory Visit: Payer: Self-pay | Admitting: Urology

## 2022-02-26 DIAGNOSIS — E1169 Type 2 diabetes mellitus with other specified complication: Secondary | ICD-10-CM | POA: Diagnosis not present

## 2022-02-26 DIAGNOSIS — E785 Hyperlipidemia, unspecified: Secondary | ICD-10-CM | POA: Diagnosis not present

## 2022-02-26 DIAGNOSIS — Z794 Long term (current) use of insulin: Secondary | ICD-10-CM | POA: Diagnosis not present

## 2022-02-26 DIAGNOSIS — E1165 Type 2 diabetes mellitus with hyperglycemia: Secondary | ICD-10-CM | POA: Diagnosis not present

## 2022-02-27 ENCOUNTER — Other Ambulatory Visit: Payer: Self-pay

## 2022-02-27 ENCOUNTER — Telehealth: Payer: 59 | Admitting: Urology

## 2022-03-02 DIAGNOSIS — E1165 Type 2 diabetes mellitus with hyperglycemia: Secondary | ICD-10-CM | POA: Diagnosis not present

## 2022-03-02 DIAGNOSIS — Z794 Long term (current) use of insulin: Secondary | ICD-10-CM | POA: Diagnosis not present

## 2022-03-02 DIAGNOSIS — E785 Hyperlipidemia, unspecified: Secondary | ICD-10-CM | POA: Diagnosis not present

## 2022-03-02 DIAGNOSIS — E1169 Type 2 diabetes mellitus with other specified complication: Secondary | ICD-10-CM | POA: Diagnosis not present

## 2022-04-10 IMAGING — MG MM DIGITAL SCREENING BILAT W/ TOMO AND CAD
8 series · 8 of 24 positions shown · non-contrast
Comparison: Previous exam(s).

CLINICAL DATA: Screening.

EXAM:
DIGITAL SCREENING BILATERAL MAMMOGRAM WITH TOMOSYNTHESIS AND CAD
TECHNIQUE: Bilateral screening digital craniocaudal and mediolateral oblique
mammograms were obtained. Bilateral screening digital breast
tomosynthesis was performed. The images were evaluated with
computer-aided detection.

[R CC synth-2D]
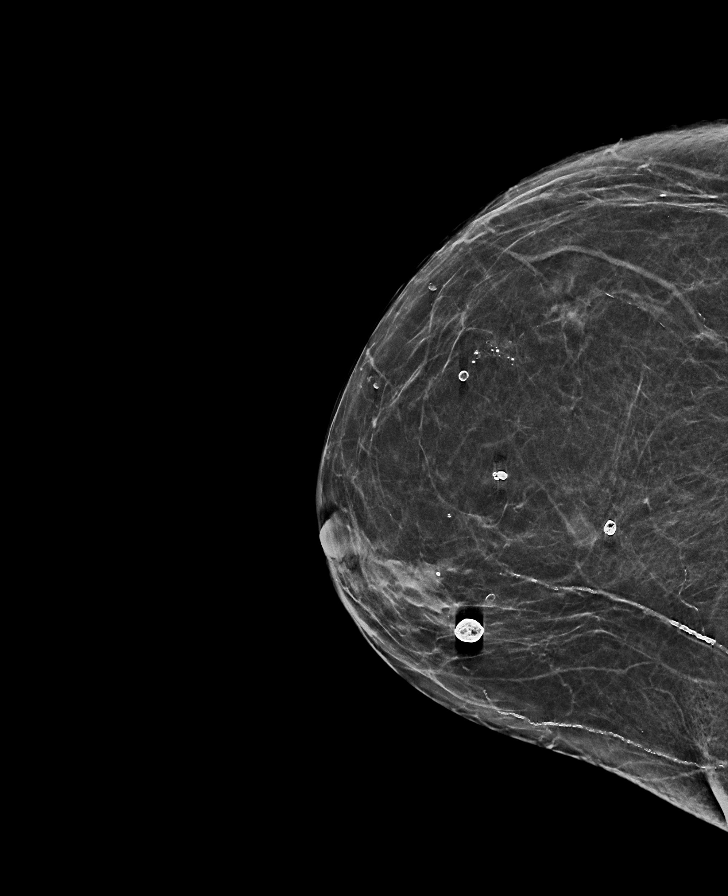

[R MLO synth-2D]
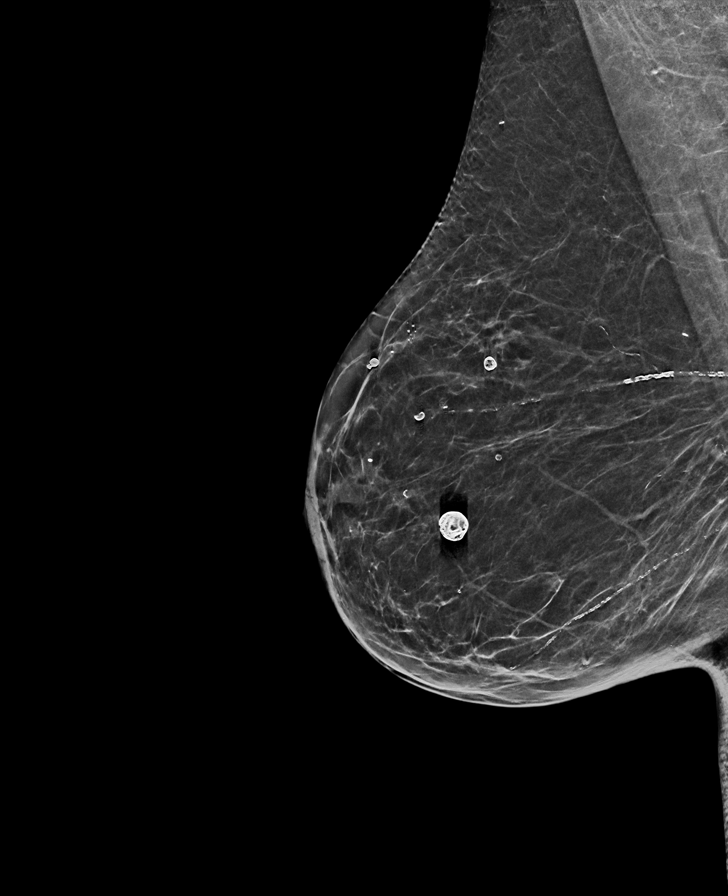

[L CC synth-2D]
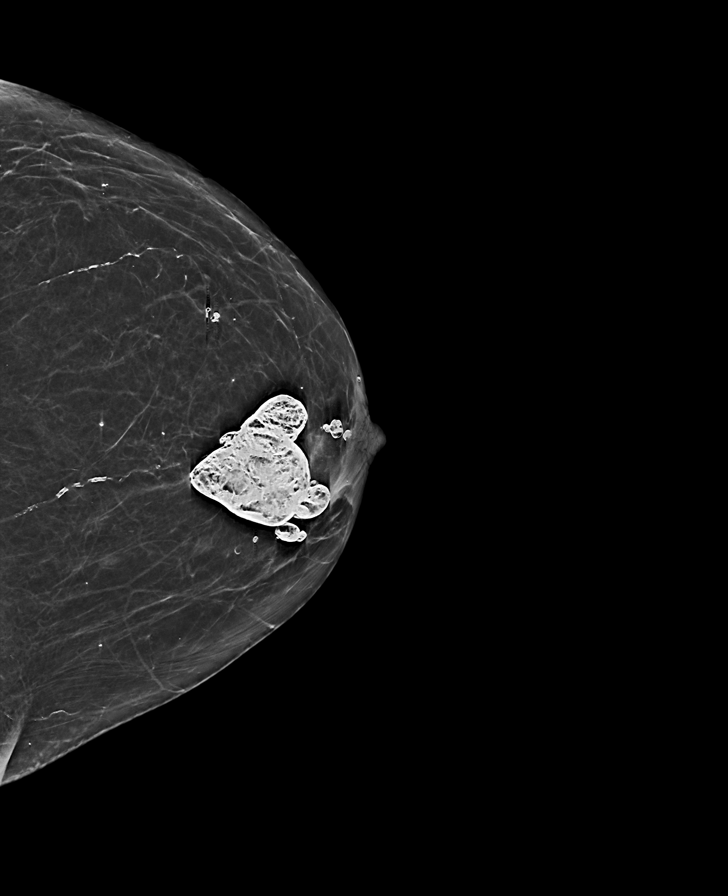

[L MLO synth-2D]
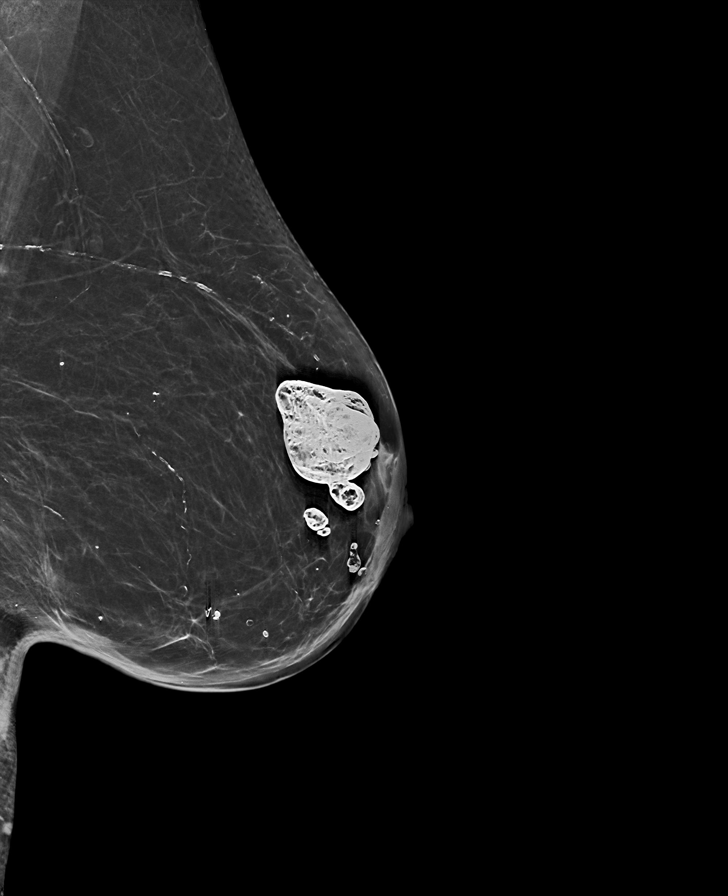

[R MLO tomo · tomo slice 29/58.0]
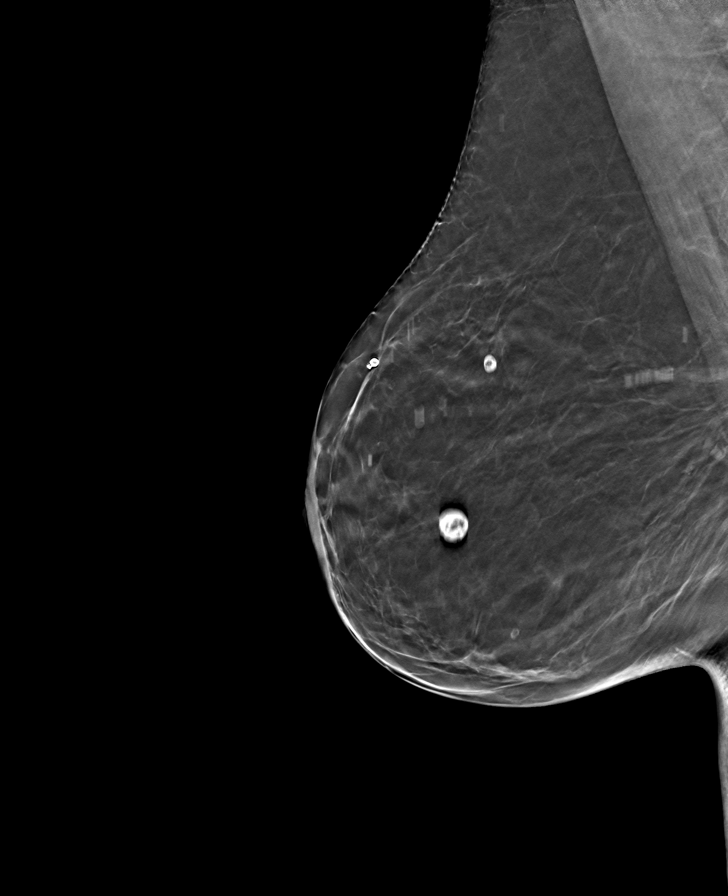

[L CC tomo · tomo slice 26/51.0]
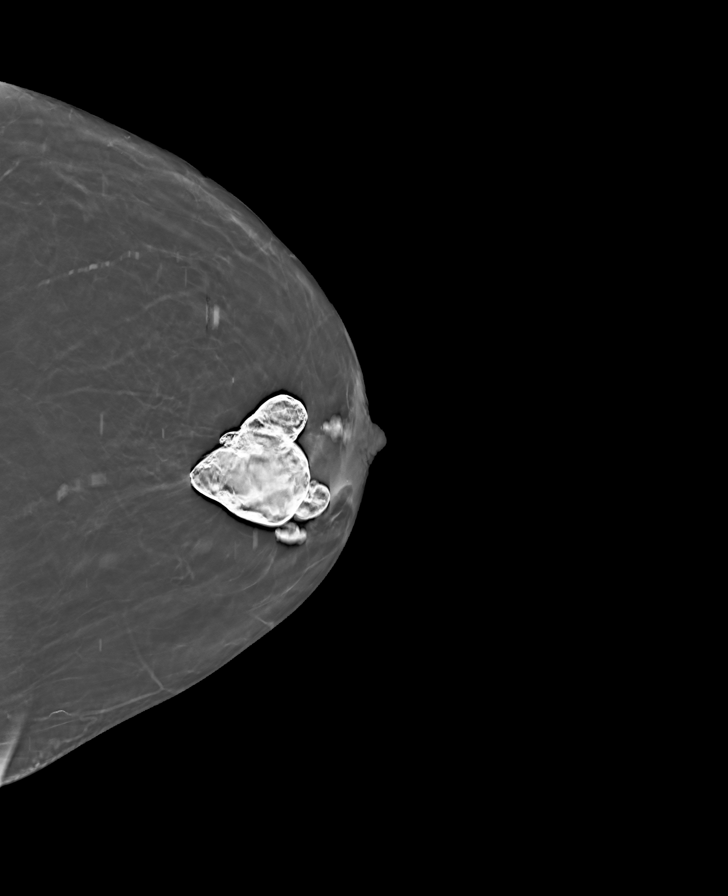

[L MLO tomo · tomo slice 31/60.0]
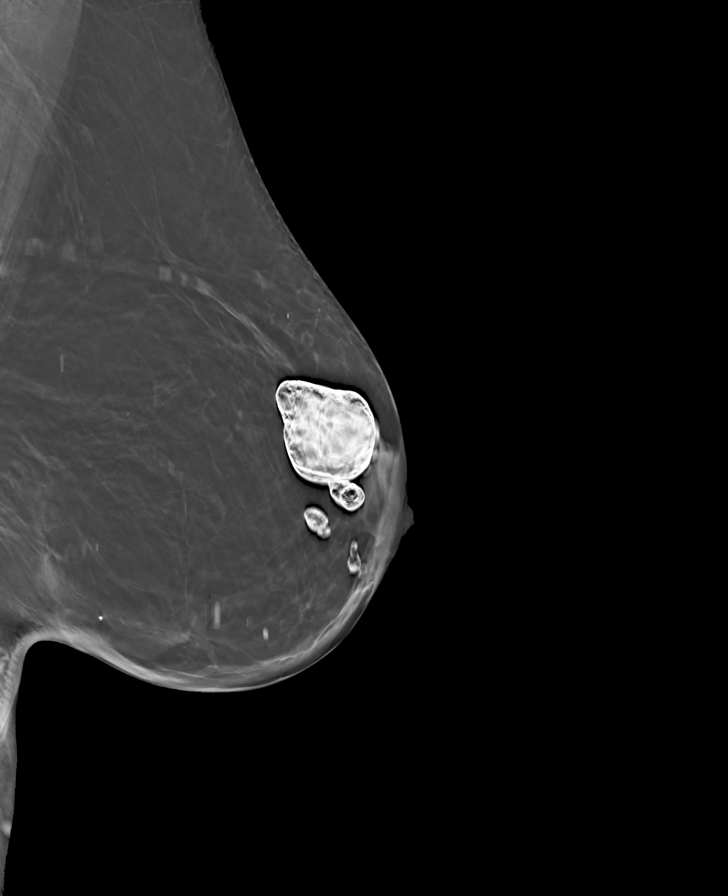

[R CC tomo · tomo slice 25/49.0]
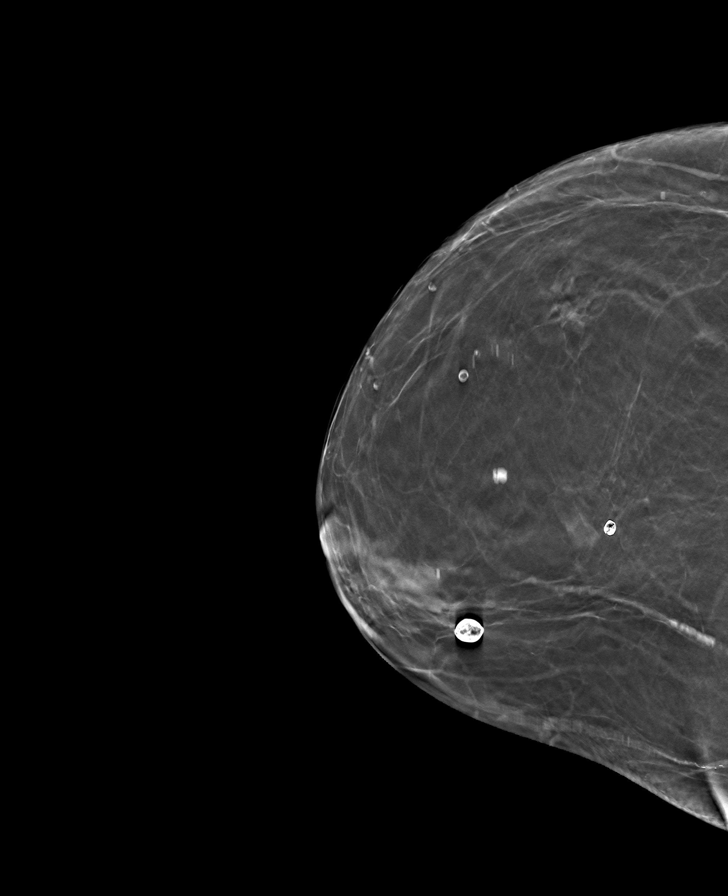

[8 of 24 positions shown; findings below may reference images not displayed]

ACR Breast Density Category b: There are scattered areas of
fibroglandular density.
FINDINGS: There are no findings suspicious for malignancy.
IMPRESSION: No mammographic evidence of malignancy. A result letter of this
screening mammogram will be mailed directly to the patient.

RECOMMENDATION:
Screening mammogram in one year. (Code:51-O-LD2)

BI-RADS CATEGORY  1: Negative.

## 2022-05-01 ENCOUNTER — Ambulatory Visit: Payer: 59 | Admitting: Urology

## 2022-05-07 ENCOUNTER — Ambulatory Visit
Admission: RE | Admit: 2022-05-07 | Discharge: 2022-05-07 | Disposition: A | Discharge status: Home / Self Care | Payer: 59 | Source: Ambulatory Visit | Attending: Physician Assistant | Admitting: Physician Assistant

## 2022-05-07 DIAGNOSIS — Z1231 Encounter for screening mammogram for malignant neoplasm of breast: Secondary | ICD-10-CM | POA: Diagnosis not present

## 2022-06-26 ENCOUNTER — Other Ambulatory Visit (HOSPITAL_COMMUNITY): Payer: Self-pay

## 2022-07-02 ENCOUNTER — Other Ambulatory Visit (HOSPITAL_COMMUNITY): Payer: Self-pay

## 2022-07-02 DIAGNOSIS — E1169 Type 2 diabetes mellitus with other specified complication: Secondary | ICD-10-CM | POA: Diagnosis not present

## 2022-07-02 DIAGNOSIS — Z794 Long term (current) use of insulin: Secondary | ICD-10-CM | POA: Diagnosis not present

## 2022-07-02 DIAGNOSIS — E1165 Type 2 diabetes mellitus with hyperglycemia: Secondary | ICD-10-CM | POA: Diagnosis not present

## 2022-07-02 DIAGNOSIS — E785 Hyperlipidemia, unspecified: Secondary | ICD-10-CM | POA: Diagnosis not present

## 2022-07-02 MED ORDER — METFORMIN HCL 1000 MG PO TABS
ORAL_TABLET | ORAL | 3 refills | Status: AC
  Filled 2022-07-02: qty 180, 90d supply, fill #0
  Filled 2022-10-23: qty 180, 90d supply, fill #1

## 2022-07-02 MED ORDER — FREESTYLE LIBRE 2 SENSOR MISC
3 refills | Status: AC
  Filled 2022-07-02: qty 6, 84d supply, fill #0
  Filled 2022-10-23: qty 6, 84d supply, fill #1

## 2022-07-02 MED ORDER — ROSUVASTATIN CALCIUM 5 MG PO TABS
ORAL_TABLET | ORAL | 3 refills | Status: AC
  Filled 2022-07-02: qty 90, 90d supply, fill #0
  Filled 2022-10-23: qty 90, 90d supply, fill #1

## 2022-07-03 ENCOUNTER — Other Ambulatory Visit (HOSPITAL_COMMUNITY): Payer: Self-pay

## 2022-07-04 ENCOUNTER — Other Ambulatory Visit (HOSPITAL_COMMUNITY): Payer: Self-pay

## 2022-07-04 MED ORDER — FLUOXETINE HCL 20 MG PO CAPS
ORAL_CAPSULE | ORAL | 1 refills | Status: AC
  Filled 2022-07-04: qty 90, 90d supply, fill #0
  Filled 2022-10-23: qty 90, 90d supply, fill #1

## 2022-10-23 ENCOUNTER — Other Ambulatory Visit (HOSPITAL_COMMUNITY): Payer: Self-pay

## 2022-11-13 ENCOUNTER — Other Ambulatory Visit (HOSPITAL_COMMUNITY): Payer: Self-pay

## 2022-11-13 MED ORDER — PANTOPRAZOLE SODIUM 40 MG PO TBEC
40.0000 mg | DELAYED_RELEASE_TABLET | Freq: Two times a day (BID) | ORAL | 3 refills | Status: AC
  Filled 2022-11-13: qty 180, 90d supply, fill #0

## 2022-12-20 DIAGNOSIS — R350 Frequency of micturition: Secondary | ICD-10-CM | POA: Diagnosis not present

## 2022-12-20 DIAGNOSIS — N3941 Urge incontinence: Secondary | ICD-10-CM | POA: Diagnosis not present

## 2022-12-26 DIAGNOSIS — E1169 Type 2 diabetes mellitus with other specified complication: Secondary | ICD-10-CM | POA: Diagnosis not present

## 2022-12-26 DIAGNOSIS — Z794 Long term (current) use of insulin: Secondary | ICD-10-CM | POA: Diagnosis not present

## 2022-12-26 DIAGNOSIS — E785 Hyperlipidemia, unspecified: Secondary | ICD-10-CM | POA: Diagnosis not present

## 2022-12-26 DIAGNOSIS — D508 Other iron deficiency anemias: Secondary | ICD-10-CM | POA: Diagnosis not present

## 2022-12-26 DIAGNOSIS — E1165 Type 2 diabetes mellitus with hyperglycemia: Secondary | ICD-10-CM | POA: Diagnosis not present

## 2023-01-02 DIAGNOSIS — F419 Anxiety disorder, unspecified: Secondary | ICD-10-CM | POA: Diagnosis not present

## 2023-01-02 DIAGNOSIS — E1165 Type 2 diabetes mellitus with hyperglycemia: Secondary | ICD-10-CM | POA: Diagnosis not present

## 2023-01-02 DIAGNOSIS — Z0000 Encounter for general adult medical examination without abnormal findings: Secondary | ICD-10-CM | POA: Diagnosis not present

## 2023-01-02 DIAGNOSIS — Z780 Asymptomatic menopausal state: Secondary | ICD-10-CM | POA: Diagnosis not present

## 2023-01-02 DIAGNOSIS — Z794 Long term (current) use of insulin: Secondary | ICD-10-CM | POA: Diagnosis not present

## 2023-01-02 DIAGNOSIS — E1169 Type 2 diabetes mellitus with other specified complication: Secondary | ICD-10-CM | POA: Diagnosis not present

## 2023-01-02 DIAGNOSIS — E785 Hyperlipidemia, unspecified: Secondary | ICD-10-CM | POA: Diagnosis not present

## 2023-01-02 DIAGNOSIS — Z87440 Personal history of urinary (tract) infections: Secondary | ICD-10-CM | POA: Diagnosis not present

## 2023-01-04 DIAGNOSIS — Z794 Long term (current) use of insulin: Secondary | ICD-10-CM | POA: Diagnosis not present

## 2023-01-04 DIAGNOSIS — E785 Hyperlipidemia, unspecified: Secondary | ICD-10-CM | POA: Diagnosis not present

## 2023-01-04 DIAGNOSIS — E1169 Type 2 diabetes mellitus with other specified complication: Secondary | ICD-10-CM | POA: Diagnosis not present

## 2023-01-04 DIAGNOSIS — E1165 Type 2 diabetes mellitus with hyperglycemia: Secondary | ICD-10-CM | POA: Diagnosis not present

## 2023-02-14 ENCOUNTER — Ambulatory Visit: Payer: Self-pay | Admitting: Urology

## 2023-03-15 DIAGNOSIS — E1165 Type 2 diabetes mellitus with hyperglycemia: Secondary | ICD-10-CM | POA: Diagnosis not present

## 2023-03-15 DIAGNOSIS — E785 Hyperlipidemia, unspecified: Secondary | ICD-10-CM | POA: Diagnosis not present

## 2023-03-15 DIAGNOSIS — E1169 Type 2 diabetes mellitus with other specified complication: Secondary | ICD-10-CM | POA: Diagnosis not present

## 2023-03-15 DIAGNOSIS — Z794 Long term (current) use of insulin: Secondary | ICD-10-CM | POA: Diagnosis not present

## 2023-04-05 DIAGNOSIS — E1165 Type 2 diabetes mellitus with hyperglycemia: Secondary | ICD-10-CM | POA: Diagnosis not present

## 2023-04-05 DIAGNOSIS — Z794 Long term (current) use of insulin: Secondary | ICD-10-CM | POA: Diagnosis not present

## 2023-04-16 ENCOUNTER — Other Ambulatory Visit
Admission: RE | Admit: 2023-04-16 | Discharge: 2023-04-16 | Disposition: A | Discharge status: Home / Self Care | Payer: Medicare HMO | Source: Home / Self Care | Attending: Urology | Admitting: Urology

## 2023-04-16 ENCOUNTER — Other Ambulatory Visit: Payer: Self-pay | Admitting: Urology

## 2023-04-16 ENCOUNTER — Other Ambulatory Visit: Payer: Self-pay

## 2023-04-16 ENCOUNTER — Ambulatory Visit
Admission: RE | Admit: 2023-04-16 | Discharge: 2023-04-16 | Disposition: A | Discharge status: Home / Self Care | Payer: Medicare HMO | Source: Ambulatory Visit | Attending: Urology | Admitting: Urology

## 2023-04-16 ENCOUNTER — Ambulatory Visit: Payer: Medicare HMO | Admitting: Urology

## 2023-04-16 ENCOUNTER — Ambulatory Visit
Admission: RE | Admit: 2023-04-16 | Discharge: 2023-04-16 | Disposition: A | Discharge status: Home / Self Care | Payer: Medicare HMO | Attending: Urology | Admitting: Urology

## 2023-04-16 ENCOUNTER — Encounter: Payer: Self-pay | Admitting: Urology

## 2023-04-16 VITALS — BP 117/73 | HR 93 | Ht 62.0 in | Wt 158.0 lb

## 2023-04-16 DIAGNOSIS — N200 Calculus of kidney: Secondary | ICD-10-CM

## 2023-04-16 DIAGNOSIS — N201 Calculus of ureter: Secondary | ICD-10-CM

## 2023-04-16 DIAGNOSIS — R1032 Left lower quadrant pain: Secondary | ICD-10-CM

## 2023-04-16 DIAGNOSIS — R109 Unspecified abdominal pain: Secondary | ICD-10-CM | POA: Diagnosis not present

## 2023-04-16 DIAGNOSIS — K8020 Calculus of gallbladder without cholecystitis without obstruction: Secondary | ICD-10-CM | POA: Diagnosis not present

## 2023-04-16 MED ORDER — TAMSULOSIN HCL 0.4 MG PO CAPS: 0.4000 mg | ORAL_CAPSULE | Freq: Every day | ORAL | 0 refills | Status: AC

## 2023-04-16 MED ORDER — KETOROLAC TROMETHAMINE 10 MG PO TABS: 10.0000 mg | ORAL_TABLET | Freq: Four times a day (QID) | ORAL | 0 refills | Status: AC | PRN

## 2023-04-16 NOTE — Progress Notes (Unsigned)
Surgical Physician Order Form [facility]  * Scheduling expectation :  2-4 weeks  *Length of Case: 1 hour  *Clearance needed: no  *Anticoagulation Instructions: May continue all anticoagulants  *Aspirin Instructions: Ok to continue all  *Post-op visit Date/Instructions:   tbd  *Diagnosis: Left Ureteral Stone  *Procedure: left Ureteroscopy w/laser lithotripsy & stent placement (09811)   Additional orders: N/A  -Admit type: OUTpatient  -Anesthesia: General  -VTE Prophylaxis Standing Order SCD's       Other:   -Standing Lab Orders Per Anesthesia    Lab other: UA&Urine Culture  -Standing Test orders EKG/Chest x-ray per Anesthesia       Test other:   - Medications:  Ancef 2gm IV  -Other orders:  N/A  Patient prefers 2 to 4-week trial of Flomax to see if she can pass her stones, can cancel surgery if stones passed

## 2023-04-16 NOTE — Progress Notes (Signed)
   04/16/2023 1:44 PM   Erika Gutierrez [birth year] [id number]  Reason for visit: Add-on visit for possible kidney stone  HPI: 67 year old female who I previously followed for nephrolithiasis, she underwent left ureteroscopy in December 2022 with removal of a 6 mm distal ureteral stone as well as multiple moderate-sized renal stones.  She also completed a 24-hour urine with risk factors of very low urine volume and low urine citrate.  She did not follow-up as recommended for KUB.    She presents today with about a week of intermittent midline low back pain, pelvic pressure, urinary urgency/frequency, and gross hematuria.  She feels this is similar to prior stone events.  She denies any fevers or chills.She has not yet given a urinalysis today.  I personally viewed and interpreted the KUB today that shows a number of calcifications of the abdomen and pelvis.  She has numerous phleboliths on prior CT.  On the KUB today there are number of calcifications in the left lower quadrant that appear to be numerous stones stacked in the left distal ureter, there is a calcification in the left upper quadrant that corresponds with phleboliths outside the ureter on prior CT.  Also right-sided calcifications similar to prior KUB.  With her vague symptoms with unclear laterality and KUB difficult to interpret with numerous phleboliths, I recommended CT today for further evaluation.  I personally viewed and interpreted the stat CT stone protocol today.  There are numerous stones in the left distal ureter, measuring approximately 2 cm burden total with some mild left hydronephrosis.  Urinalysis today with microscopic hematuria with 20-50 RBC but otherwise benign.  We discussed various treatment options for urolithiasis including observation with or without medical expulsive therapy, shockwave lithotripsy (SWL), ureteroscopy and laser lithotripsy with stent placement, and percutaneous nephrolithotomy.We discussed  that management is based on stone size, location, density, patient co-morbidities, and patient preference. Stones <41mm in size have a >80% spontaneous passage rate. Data surrounding the use of tamsulosin for medical expulsive therapy is controversial, but meta analyses suggests it is most efficacious for distal stones between 5-69mm in size. Possible side effects include dizziness/lightheadedness, and retrograde ejaculation.SWL has a lower stone free rate in a single procedure, but also a lower complication rate compared to ureteroscopy and avoids a stent and associated stent related symptoms. Possible complications include renal hematoma, steinstrasse, and need for additional treatment.Ureteroscopy with laser lithotripsy and stent placement has a higher stone free rate than SWL in a single procedure, however increased complication rate including possible infection, ureteral injury, bleeding, and stent related morbidity. Common stent related symptoms include dysuria, urgency/frequency, and flank pain.  High suspicion she will ultimately require ureteroscopy for the stones, but she is interested in a trial of medical expulsive therapy since she has only mild symptoms at this time.  Risks and benefits reviewed at length.  She was ultimately amenable to scheduling ureteroscopy/laser lithotripsy/stent placement in 2 to 3 weeks in case she does not pass all her stones.  If she does pass stones, recommended KUB to confirm clearance.  Flomax and Toradol sent in, return precautions discussed at length.   Sondra Come, MD  [facility] [address], [address] [place], [place] [address] [phone]

## 2023-04-17 ENCOUNTER — Telehealth: Payer: Self-pay

## 2023-04-17 LAB — URINALYSIS, COMPLETE (UACMP) WITH MICROSCOPIC
Bilirubin Urine: NEGATIVE
Glucose, UA: NEGATIVE mg/dL
Hgb urine dipstick: NEGATIVE
Ketones, ur: NEGATIVE mg/dL
Leukocytes,Ua: NEGATIVE
Nitrite: NEGATIVE
Protein, ur: NEGATIVE mg/dL
Specific Gravity, Urine: 1.02 (ref 1.005–1.030)
pH: 7 (ref 5.0–8.0)

## 2023-04-17 NOTE — Telephone Encounter (Signed)
I spoke with Erika Gutierrez. We have discussed possible surgery dates and Friday June 7th, 2024 was agreed upon by all parties. Patient given information about surgery date, what to expect pre-operatively and post operatively.  We discussed that a Pre-Admission Testing office will be calling to set up the pre-op visit that will take place prior to surgery, and that these appointments are typically done over the phone with a Pre-Admissions RN. Informed patient that our office will communicate any additional care to be provided after surgery. Patients questions or concerns were discussed during our call. Advised to call our office should there be any additional information, questions or concerns that arise. Patient verbalized understanding.

## 2023-04-17 NOTE — Progress Notes (Signed)
   [facility] Surgical Posting Form  Surgery Date: Date: 05/10/2023  Surgeon: Dr. Legrand Rams, MD  Inpt ( No  )   Outpt (Yes)   Obs ( No  )   Diagnosis: N20.1 Left Ureteral Stone  -CPT: [phone]  Surgery: Left Ureteroscopy with Laser Lithotripsy and Stent Placement  Stop Anticoagulations: No, may also continue ASA  Cardiac/Medical/Pulmonary Clearance needed: no  *Orders entered into EPIC  Date: 04/17/23   *Case booked in EPIC  Date: 04/17/23  *Notified pt of Surgery: Date: 04/17/23  PRE-OP UA & CX: yes, will obtain at the [facility] on 04/26/2023  *Placed into Prior Authorization Work Angela Nevin Date: 04/17/23  Assistant/laser/rep:No  Patient aware to hold [facility] on SUNDAY 05/05/2023.

## 2023-04-18 LAB — URINE CULTURE

## 2023-04-26 ENCOUNTER — Inpatient Hospital Stay
Admission: RE | Admit: 2023-04-26 | Discharge: 2023-04-26 | Disposition: A | Discharge status: Home / Self Care | Payer: Medicare HMO | Source: Ambulatory Visit

## 2023-04-26 NOTE — Pre-Procedure Instructions (Signed)
Called the  patient to do the pre-anesthesia interview and the patient stated that the procedure -laser lithotripsy and stent placement  supposed to be cancelled due to she already passed the stones. We will confirm with surgeon's office.

## 2023-05-08 ENCOUNTER — Other Ambulatory Visit: Payer: Self-pay | Admitting: Urology

## 2023-05-10 ENCOUNTER — Ambulatory Visit: Admit: 2023-05-10 | Payer: Medicare HMO | Admitting: Urology

## 2023-05-10 SURGERY — CYSTOSCOPY/URETEROSCOPY/HOLMIUM LASER/STENT PLACEMENT
Anesthesia: General | Laterality: Left

## 2023-06-28 DIAGNOSIS — Z794 Long term (current) use of insulin: Secondary | ICD-10-CM | POA: Diagnosis not present

## 2023-06-28 DIAGNOSIS — R8290 Unspecified abnormal findings in urine: Secondary | ICD-10-CM | POA: Diagnosis not present

## 2023-06-28 DIAGNOSIS — E1165 Type 2 diabetes mellitus with hyperglycemia: Secondary | ICD-10-CM | POA: Diagnosis not present

## 2023-06-28 DIAGNOSIS — E1169 Type 2 diabetes mellitus with other specified complication: Secondary | ICD-10-CM | POA: Diagnosis not present

## 2023-06-28 DIAGNOSIS — E785 Hyperlipidemia, unspecified: Secondary | ICD-10-CM | POA: Diagnosis not present

## 2023-06-28 DIAGNOSIS — F419 Anxiety disorder, unspecified: Secondary | ICD-10-CM | POA: Diagnosis not present

## 2023-07-04 ENCOUNTER — Other Ambulatory Visit: Payer: Self-pay | Admitting: Physician Assistant

## 2023-07-04 DIAGNOSIS — Z1231 Encounter for screening mammogram for malignant neoplasm of breast: Secondary | ICD-10-CM | POA: Diagnosis not present

## 2023-07-04 DIAGNOSIS — D509 Iron deficiency anemia, unspecified: Secondary | ICD-10-CM | POA: Diagnosis not present

## 2023-07-04 DIAGNOSIS — F419 Anxiety disorder, unspecified: Secondary | ICD-10-CM | POA: Diagnosis not present

## 2023-07-04 DIAGNOSIS — E785 Hyperlipidemia, unspecified: Secondary | ICD-10-CM | POA: Diagnosis not present

## 2023-07-04 DIAGNOSIS — E1169 Type 2 diabetes mellitus with other specified complication: Secondary | ICD-10-CM | POA: Diagnosis not present

## 2023-07-04 DIAGNOSIS — Z0000 Encounter for general adult medical examination without abnormal findings: Secondary | ICD-10-CM | POA: Diagnosis not present

## 2023-07-04 DIAGNOSIS — Z794 Long term (current) use of insulin: Secondary | ICD-10-CM | POA: Diagnosis not present

## 2023-07-04 DIAGNOSIS — E1165 Type 2 diabetes mellitus with hyperglycemia: Secondary | ICD-10-CM | POA: Diagnosis not present

## 2023-07-04 DIAGNOSIS — Z780 Asymptomatic menopausal state: Secondary | ICD-10-CM | POA: Diagnosis not present

## 2023-07-31 ENCOUNTER — Ambulatory Visit
Admission: RE | Admit: 2023-07-31 | Discharge: 2023-07-31 | Disposition: A | Discharge status: Home / Self Care | Payer: Medicare HMO | Source: Ambulatory Visit | Attending: Physician Assistant | Admitting: Physician Assistant

## 2023-07-31 DIAGNOSIS — Z1231 Encounter for screening mammogram for malignant neoplasm of breast: Secondary | ICD-10-CM | POA: Diagnosis not present

## 2023-08-02 DIAGNOSIS — E1165 Type 2 diabetes mellitus with hyperglycemia: Secondary | ICD-10-CM | POA: Diagnosis not present

## 2023-08-02 DIAGNOSIS — Z794 Long term (current) use of insulin: Secondary | ICD-10-CM | POA: Diagnosis not present

## 2023-08-02 DIAGNOSIS — E785 Hyperlipidemia, unspecified: Secondary | ICD-10-CM | POA: Diagnosis not present

## 2023-08-02 DIAGNOSIS — E1169 Type 2 diabetes mellitus with other specified complication: Secondary | ICD-10-CM | POA: Diagnosis not present

## 2023-08-07 DIAGNOSIS — J479 Bronchiectasis, uncomplicated: Secondary | ICD-10-CM | POA: Diagnosis not present

## 2023-08-07 DIAGNOSIS — R0602 Shortness of breath: Secondary | ICD-10-CM | POA: Diagnosis not present

## 2023-08-07 DIAGNOSIS — R8271 Bacteriuria: Secondary | ICD-10-CM | POA: Diagnosis not present

## 2023-08-07 DIAGNOSIS — R5383 Other fatigue: Secondary | ICD-10-CM | POA: Diagnosis not present

## 2023-08-07 DIAGNOSIS — Z03818 Encounter for observation for suspected exposure to other biological agents ruled out: Secondary | ICD-10-CM | POA: Diagnosis not present

## 2023-08-12 DIAGNOSIS — R0609 Other forms of dyspnea: Secondary | ICD-10-CM | POA: Diagnosis not present

## 2023-08-12 DIAGNOSIS — R0789 Other chest pain: Secondary | ICD-10-CM | POA: Diagnosis not present

## 2023-08-28 DIAGNOSIS — H5213 Myopia, bilateral: Secondary | ICD-10-CM | POA: Diagnosis not present

## 2023-09-11 DIAGNOSIS — Z0100 Encounter for examination of eyes and vision without abnormal findings: Secondary | ICD-10-CM | POA: Diagnosis not present

## 2023-10-21 ENCOUNTER — Ambulatory Visit: Payer: Medicare HMO | Admitting: Urology

## 2024-01-07 ENCOUNTER — Ambulatory Visit: Payer: Medicare HMO | Admitting: Urology

## 2024-07-14 ENCOUNTER — Other Ambulatory Visit: Payer: Self-pay | Admitting: Physician Assistant

## 2024-07-14 DIAGNOSIS — Z1231 Encounter for screening mammogram for malignant neoplasm of breast: Secondary | ICD-10-CM

## 2024-08-20 ENCOUNTER — Ambulatory Visit
Admission: RE | Admit: 2024-08-20 | Discharge: 2024-08-20 | Disposition: A | Discharge status: Home / Self Care | Payer: Self-pay | Source: Ambulatory Visit | Attending: Physician Assistant | Admitting: Physician Assistant

## 2024-08-20 DIAGNOSIS — Z1231 Encounter for screening mammogram for malignant neoplasm of breast: Secondary | ICD-10-CM | POA: Insufficient documentation
